# Patient Record
Sex: Female | Born: 1970 | Race: White | Hispanic: No | Marital: Single | Smoking: Current every day smoker
Health system: Southern US, Community
[De-identification: ages and names within clinical notes are randomized; demographics above are authoritative.]

## PROBLEM LIST (undated history)

## (undated) DIAGNOSIS — I10 Essential (primary) hypertension: Secondary | ICD-10-CM

## (undated) DIAGNOSIS — I340 Nonrheumatic mitral (valve) insufficiency: Secondary | ICD-10-CM

## (undated) DIAGNOSIS — Z720 Tobacco use: Secondary | ICD-10-CM

## (undated) DIAGNOSIS — E7800 Pure hypercholesterolemia, unspecified: Secondary | ICD-10-CM

## (undated) DIAGNOSIS — I201 Angina pectoris with documented spasm: Secondary | ICD-10-CM

## (undated) HISTORY — PX: TUBAL LIGATION: SHX77

## (undated) HISTORY — PX: CHOLECYSTECTOMY: SHX55

---

## 2017-10-01 DIAGNOSIS — I340 Nonrheumatic mitral (valve) insufficiency: Secondary | ICD-10-CM

## 2017-10-01 HISTORY — DX: Nonrheumatic mitral (valve) insufficiency: I34.0

## 2017-11-26 ENCOUNTER — Emergency Department
Admission: EM | Admit: 2017-11-26 | Discharge: 2017-11-26 | Discharge status: Against Medical Advice | Payer: Medicaid - Out of State | Attending: Emergency Medicine | Admitting: Emergency Medicine

## 2017-11-26 ENCOUNTER — Encounter: Payer: Self-pay | Admitting: Emergency Medicine

## 2017-11-26 ENCOUNTER — Emergency Department: Payer: Medicaid - Out of State

## 2017-11-26 ENCOUNTER — Other Ambulatory Visit: Payer: Self-pay

## 2017-11-26 DIAGNOSIS — F17210 Nicotine dependence, cigarettes, uncomplicated: Secondary | ICD-10-CM | POA: Diagnosis not present

## 2017-11-26 DIAGNOSIS — Z79899 Other long term (current) drug therapy: Secondary | ICD-10-CM | POA: Insufficient documentation

## 2017-11-26 DIAGNOSIS — I10 Essential (primary) hypertension: Secondary | ICD-10-CM | POA: Diagnosis not present

## 2017-11-26 DIAGNOSIS — R079 Chest pain, unspecified: Secondary | ICD-10-CM | POA: Diagnosis present

## 2017-11-26 DIAGNOSIS — I214 Non-ST elevation (NSTEMI) myocardial infarction: Secondary | ICD-10-CM | POA: Insufficient documentation

## 2017-11-26 HISTORY — DX: Essential (primary) hypertension: I10

## 2017-11-26 HISTORY — DX: Nonrheumatic mitral (valve) insufficiency: I34.0

## 2017-11-26 HISTORY — DX: Angina pectoris with documented spasm: I20.1

## 2017-11-26 HISTORY — DX: Pure hypercholesterolemia, unspecified: E78.00

## 2017-11-26 HISTORY — DX: Tobacco use: Z72.0

## 2017-11-26 LAB — CBC
HCT: 44.2 % (ref 35.0–47.0)
HEMOGLOBIN: 14.7 g/dL (ref 12.0–16.0)
MCH: 28.4 pg (ref 26.0–34.0)
MCHC: 33.1 g/dL (ref 32.0–36.0)
MCV: 85.7 fL (ref 80.0–100.0)
Platelets: 218 10*3/uL (ref 150–440)
RBC: 5.16 MIL/uL (ref 3.80–5.20)
RDW: 15.8 % — ABNORMAL HIGH (ref 11.5–14.5)
WBC: 9.1 10*3/uL (ref 3.6–11.0)

## 2017-11-26 LAB — COMPREHENSIVE METABOLIC PANEL
ALBUMIN: 3.7 g/dL (ref 3.5–5.0)
ALT: 19 U/L (ref 14–54)
AST: 24 U/L (ref 15–41)
Alkaline Phosphatase: 76 U/L (ref 38–126)
Anion gap: 10 (ref 5–15)
BUN: 9 mg/dL (ref 6–20)
CO2: 23 mmol/L (ref 22–32)
CREATININE: 0.67 mg/dL (ref 0.44–1.00)
Calcium: 8.9 mg/dL (ref 8.9–10.3)
Chloride: 104 mmol/L (ref 101–111)
GFR calc Af Amer: >60 mL/min (ref >60)
GLUCOSE: 104 mg/dL — AB (ref 65–99)
POTASSIUM: 3.8 mmol/L (ref 3.5–5.1)
Sodium: 137 mmol/L (ref 135–145)
Total Bilirubin: 0.3 mg/dL (ref 0.3–1.2)
Total Protein: 7.1 g/dL (ref 6.5–8.1)

## 2017-11-26 LAB — TROPONIN I: Troponin I: 0.23 ng/mL (ref <0.03)

## 2017-11-26 LAB — BILIRUBIN, DIRECT

## 2017-11-26 MED ORDER — AMITRIPTYLINE HCL 50 MG PO TABS
25.0000 mg | ORAL_TABLET | Freq: Every day | ORAL | Status: DC
  Administered 2017-11-26: 25 mg via ORAL

## 2017-11-26 MED ORDER — AMLODIPINE BESYLATE 5 MG PO TABS
10.0000 mg | ORAL_TABLET | Freq: Once | ORAL | Status: AC
  Administered 2017-11-26: 10 mg via ORAL
  Filled 2017-11-26: qty 2

## 2017-11-26 MED ORDER — AMITRIPTYLINE HCL 10 MG PO TABS
ORAL_TABLET | ORAL | Status: AC
  Filled 2017-11-26: qty 3

## 2017-11-26 MED ORDER — ISOSORBIDE MONONITRATE ER 60 MG PO TB24
120.0000 mg | ORAL_TABLET | Freq: Every day | ORAL | Status: DC
  Administered 2017-11-26: 120 mg via ORAL
  Filled 2017-11-26: qty 2

## 2017-11-26 MED ORDER — ATORVASTATIN CALCIUM 20 MG PO TABS
80.0000 mg | ORAL_TABLET | Freq: Every day | ORAL | Status: DC
  Administered 2017-11-26: 80 mg via ORAL

## 2017-11-26 MED ORDER — NITROGLYCERIN 0.4 MG SL SUBL: 0.4000 mg | SUBLINGUAL_TABLET | SUBLINGUAL | 3 refills | Status: DC | PRN

## 2017-11-26 MED ORDER — ATORVASTATIN CALCIUM 20 MG PO TABS
ORAL_TABLET | ORAL | Status: AC
  Administered 2017-11-26: 80 mg via ORAL
  Filled 2017-11-26: qty 4

## 2017-11-26 MED ORDER — CITALOPRAM HYDROBROMIDE 20 MG PO TABS
40.0000 mg | ORAL_TABLET | Freq: Every day | ORAL | Status: DC
  Administered 2017-11-26: 40 mg via ORAL
  Filled 2017-11-26: qty 2

## 2017-11-26 NOTE — ED Provider Notes (Signed)
[facility] Emergency Department Provider Note   ____________________________________________   First MD Initiated Contact with Patient 11/26/17 1516     (approximate)  I have reviewed the triage vital signs and the nursing notes.   HISTORY  Chief Complaint Chest Pain    Virginia White is a 47 y.o. female Who comes in complaining of chest tightness with nausea and some vomiting and sweating. Started at 3:00 this morning she has had multiple short episodes but then 1 episode didn't last about 30 minutes. She says when she gets these longer episodes her troponin goes up. She is having vasospasm. She says she just moved to the area from [place]. She reports she is a smoker although she started Chantix 3 days ago. Last 2 times she is taking Chantix she got nauseated. He has a family history of heart disease and mother had a heart attack at age 48 grandfather had a heart attack at age 48 has had a CABG 5 brothers 48 years old and has extensive 3 heart attacks. Patient reports she was having chest pain when EMS arrived they gave her 2 nitroglycerin sprays the pain is not totally gone.   Past Medical History:  Diagnosis Date  . Elevated cholesterol   . Hypertension   . 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   Past Surgical History:  Procedure Laterality Date  . CHOLECYSTECTOMY    . TUBAL LIGATION      Prior to Admission medications   Medication Sig Start Date End Date Taking? Authorizing Provider  amitriptyline (ELAVIL) 25 MG tablet Take 25 mg by mouth at bedtime. 10/25/17  Yes [provider]  amLODipine (NORVASC) 10 MG tablet Take 10 mg by mouth daily. 10/24/17  Yes [provider]  atorvastatin (LIPITOR) 80 MG tablet Take 80 mg by mouth daily.   Yes [provider]  citalopram (CELEXA) 40 MG tablet Take 40 mg by mouth daily.   Yes [provider]  isosorbide mononitrate (IMDUR) 60 MG 24 hr tablet Take 120 mg by mouth daily. 10/24/17  Yes [provider]    Allergies Patient has no allergy information on record.  History reviewed. No pertinent family history.  Social History Social History   Tobacco Use  . Smoking status: Current Every Day Smoker    Packs/day: 0.50    Types: Cigarettes  . Smokeless tobacco: Never Used  Substance Use Topics  . Alcohol use: No    Frequency: Never  . Drug use: No    Review of Systems  Constitutional: No fever/chills Eyes: No visual changes. ENT: No sore throat. Cardiovascular: Denies chest pain at present. Respiratory: Denies shortness of breath. Gastrointestinal: at present No abdominal pain.  No nausea, no vomiting.  No diarrhea.  No constipation. Genitourinary: Negative for dysuria. Musculoskeletal: Negative for back pain. Skin: Negative for rash. Neurological: Negative for headaches, focal weakness or   ____________________________________________   PHYSICAL EXAM:  VITAL SIGNS: ED Triage Vitals [11/26/17 1518]  Enc Vitals Group     BP (!) 129/91     Pulse Rate 77     Resp 10     Temp 97.6 F (36.4 C)     Temp Source Oral     SpO2 100 %     Weight 210 lb (95.3 kg)     Height 5\' 6"  (1.676 m)     Head  Circumference      Peak Flow      Pain Score 2     Pain Loc      Pain Edu?      Excl. in GC?     Constitutional: Alert and oriented. Well appearing and in no acute distress. Eyes: Conjunctivae are normal.  Head: Atraumatic. Nose: No congestion/rhinnorhea. Mouth/Throat: Mucous membranes are moist.  Oropharynx non-erythematous. Neck: No stridor. Cardiovascular: Normal rate, regular rhythm. Grossly normal heart sounds.  Good peripheral circulation. Respiratory: Normal respiratory effort.  No retractions. Lungs CTAB. Gastrointestinal: Soft and nontender. No distention. No abdominal bruits. No CVA tenderness  Musculoskeletal: No lower extremity  tenderness nor edema.  No joint effusions. Neurologic:  Normal speech and language. No gross focal neurologic deficits are appreciated. No gait instability. Skin:  Skin is warm, dry and intact. patient has vascular spiders on her upper chest and back. Psychiatric: Mood and affect are normal. Speech and behavior are normal.  ____________________________________________   LABS (all labs ordered are listed, but only abnormal results are displayed)  Labs Reviewed  CBC - Abnormal; Notable for the following components:      Result Value   RDW 15.8 (*)    All other components within normal limits  TROPONIN I - Abnormal; Notable for the following components:   Troponin I 0.23 (*)    All other components within normal limits  COMPREHENSIVE METABOLIC PANEL - Abnormal; Notable for the following components:   Glucose, Bld 104 (*)    All other components within normal limits  BILIRUBIN, DIRECT - Abnormal; Notable for the following components:   Bilirubin, Direct <0.1 (*)    All other components within normal limits  POC URINE PREG, ED   ____________________________________________  EKG  EKG read and interpreted by me shows right axis no acute ST-T wave changes that may be lead reversal. ____________________________________________  RADIOLOGY  ED MD interpretation:  Official radiology report(s): Dg Chest 2 View  Result Date: 11/26/2017 CLINICAL DATA:  Shortness of breath and chest pain EXAM: CHEST - 2 VIEW COMPARISON:  None. FINDINGS: There is atelectatic change in the lingula. Lungs elsewhere are clear. Heart size and pulmonary vascularity are normal. No adenopathy. No bone lesions. No pneumothorax. IMPRESSION: Atelectatic change in the lingula. Lungs elsewhere clear. No adenopathy. Electronically Signed   By: Bretta Bang III M.D.   On: 11/26/2017 15:48    ____________________________________________   PROCEDURES  Procedure(s) performed:   Procedures  Critical Care  performed:   ____________________________________________   INITIAL IMPRESSION / ASSESSMENT AND PLAN / ED COURSE  patient does not want to stay in the hospital. I have told her she is at risk for having a heart attack and dying possibly even a parking lot she understands this and can repeat back to me but she doesn't want to stay in the hospital she is getting to see her doctor tomorrow. I will give her her medicines she is post to take today that she ran out of here now. She says she's had this happen to her repeatedly in the past and if she is on her medicines doesn't I will still give her an AMA discharge she knows that this is a dangerous thing for her to do. The hospital doctor and the nurses also talked about this.         ____________________________________________   FINAL CLINICAL IMPRESSION(S) / ED DIAGNOSES  Final diagnoses:  NSTEMI (non-ST elevated myocardial infarction) [facility])     ED Discharge Orders  None       Note:  This document was prepared using Dragon voice recognition software and may include unintentional dictation errors.    Arnaldo Natal, MD 11/26/17 [phone]

## 2017-11-26 NOTE — ED Notes (Signed)
Pt educated that BP was elevated and pt reports she had just taken her medication and she was not worried about it. PT also reports that Dr. Darnelle CatalanMalinda talked to her in length about admission and she is still wanting to go home. PT is aware that she is leaving against medical advice. RN took pt to car via Wheelchair and pt was able to ambulate independently from wheelchair into car. PT in NAd at this time but is verbalizing that the pain has started to return in chest but that she still wants to leave.

## 2017-11-26 NOTE — ED Triage Notes (Signed)
Pt to ED from home reporting centralized chest pain that began at 3 this morning. PT reports pain is squeezing in nature with numbness in arms and "thumping in both ears" Pt reports she has been taking isosorbide and amlodapine but has not taken this medication in the past couple days.   SOB, diaphoretic, Nausea and vomiting reported while pain was at its worst. EMS administered 324 ASA and 2 sparys of Nitro that pt report decreased her 10/10 pain to 2/10. Pt also recieved 4mg  of Zofran after reporting nausea currently with vomiting last night. Per pt has hx of multiple MIs

## 2017-11-26 NOTE — Discharge Instructions (Signed)
I really wish you would stay in the hospital would be much safer. If you develop chest pain again please sit down or lay down and take one of the nitroglycerin under the tongue. If that does not get you to the pain wait 5 minutes take another one and if that doesn't get rid of the pain take a third one 5 minutes after the second one. I would call the ambulance if you get any chest pain what so ever. Make sure you see your doctor tomorrow. If you change your mind feel free to come back at any time.

## 2017-11-26 NOTE — Consult Note (Signed)
[facility] [place] [facility]   PATIENT NAME: Virginia White    MR#:  [id number]  DATE OF BIRTH:  1971-09-01  DATE OF ADMISSION:  11/26/2017  PRIMARY CARE PHYSICIAN: System, Pcp Not In   CONSULT REQUESTING/REFERRING PHYSICIAN: Dr. Darnelle CatalanMalinda  REASON FOR CONSULT: Chest pain, elevated troponin  CHIEF COMPLAINT:   Chief Complaint  Patient presents with  . Chest Pain    HISTORY OF PRESENT ILLNESS:  Virginia White  is a 47 y.o. female with a known history of hypertension, coronary vasospasms with a normal cardiac catheterization per patient 6 weeks back presents to the emergency room due to acute onset of chest pain.  Patient takes imdur and Norvasc for her blood pressure.  She has history of coronary vasospasms and uses sublingual nitro. Patient has no ST elevation.  But does have elevated troponin of 0.23.  Presently is chest pain-free.  Needs admission to the hospital.  PAST MEDICAL HISTORY:   Past Medical History:  Diagnosis Date  . Coronary vasospasm (HCC)   . Elevated cholesterol   . Hypertension   . 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 Tobacco use     PAST SURGICAL HISTOIRY:   Past Surgical History:  Procedure Laterality Date  . CHOLECYSTECTOMY    . TUBAL LIGATION      SOCIAL HISTORY:   Social History   Tobacco Use  . Smoking status: Current Every Day Smoker    Packs/day: 0.50    Types: Cigarettes  . Smokeless tobacco: Never Used  Substance Use Topics  . Alcohol use: No    Frequency: Never    FAMILY HISTORY:   Family History  Problem Relation Age of Onset  . CAD Father     DRUG ALLERGIES:  Not on File  REVIEW OF SYSTEMS:   Review of Systems  Constitutional: Positive for malaise/fatigue. Negative for chills, fever and weight loss.  HENT: Negative for hearing loss and nosebleeds.   Eyes: Negative for blurred vision, double vision and pain.  Respiratory: Negative for cough, hemoptysis, sputum  production, shortness of breath and wheezing.   Cardiovascular: Positive for chest pain. Negative for palpitations, orthopnea and leg swelling.  Gastrointestinal: Negative for abdominal pain, constipation, diarrhea, nausea and vomiting.  Genitourinary: Negative for dysuria and hematuria.  Musculoskeletal: Negative for back pain, falls and myalgias.  Skin: Negative for rash.  Neurological: Positive for weakness. Negative for dizziness, tremors, sensory change, speech change, focal weakness, seizures and headaches.  Endo/Heme/Allergies: Does not bruise/bleed easily.  Psychiatric/Behavioral: Negative for depression and memory loss. The patient is not nervous/anxious.    MEDICATIONS AT HOME:   Prior to Admission medications   Medication Sig Start Date End Date Taking? Authorizing Provider  amitriptyline (ELAVIL) 25 MG tablet Take 25 mg by mouth at bedtime. 10/25/17  Yes [provider]  amLODipine (NORVASC) 10 MG tablet Take 10 mg by mouth daily. 10/24/17  Yes [provider]  atorvastatin (LIPITOR) 80 MG tablet Take 80 mg by mouth daily.   Yes [provider]  citalopram (CELEXA) 40 MG tablet Take 40 mg by mouth daily.   Yes [provider]  isosorbide mononitrate (IMDUR) 60 MG 24 hr tablet Take 120 mg by mouth daily. 10/24/17  Yes [provider]  nitroGLYCERIN (NITROSTAT) 0.4 MG SL tablet Place 1 tablet (0.4 mg total) under the tongue every 5 (five) minutes as needed for chest pain. 11/26/17 11/26/18  Arnaldo NatalMalinda, Paul F, MD  VITAL SIGNS:  Blood pressure (!) 129/91, pulse 77, temperature 97.6 F (36.4 C), temperature source Oral, resp. rate 10, height 5\' 6"  (1.676 m), weight 95.3 kg (210 lb), SpO2 100 %.  PHYSICAL EXAMINATION:  GENERAL:  47 y.o.-year-old patient lying in the bed with no acute distress.  EYES: Pupils equal, round, reactive to light and accommodation. No scleral icterus. Extraocular muscles intact.  HEENT: Head atraumatic,  normocephalic. Oropharynx and nasopharynx clear.  NECK:  Supple, no jugular venous distention. No thyroid enlargement, no tenderness.  LUNGS: Normal breath sounds bilaterally, no wheezing, rales,rhonchi or crepitation. No use of accessory muscles of respiration.  CARDIOVASCULAR: S1, S2 normal. No murmurs, rubs, or gallops.  ABDOMEN: Soft, nontender, nondistended. Bowel sounds present. No organomegaly or mass.  EXTREMITIES: No pedal edema, cyanosis, or clubbing.  NEUROLOGIC: Cranial nerves II through XII are intact. Muscle strength 5/5 in all extremities. Sensation intact. Gait not checked.  PSYCHIATRIC: The patient is alert and oriented x 3.  SKIN: No obvious rash, lesion, or ulcer.   LABORATORY PANEL:   CBC Recent Labs  Lab 11/26/17 1525  WBC 9.1  HGB 14.7  HCT 44.2  PLT 218   ------------------------------------------------------------------------------------------------------------------  Chemistries  Recent Labs  Lab 11/26/17 1525  NA 137  K 3.8  CL 104  CO2 23  GLUCOSE 104*  BUN 9  CREATININE 0.67  CALCIUM 8.9  AST 24  ALT 19  ALKPHOS 76  BILITOT 0.3   ------------------------------------------------------------------------------------------------------------------  Cardiac Enzymes Recent Labs  Lab 11/26/17 1525  TROPONINI 0.23*   ------------------------------------------------------------------------------------------------------------------  RADIOLOGY:  Dg Chest 2 View  Result Date: 11/26/2017 CLINICAL DATA:  Shortness of breath and chest pain EXAM: CHEST - 2 VIEW COMPARISON:  None. FINDINGS: There is atelectatic change in the lingula. Lungs elsewhere are clear. Heart size and pulmonary vascularity are normal. No adenopathy. No bone lesions. No pneumothorax. IMPRESSION: Atelectatic change in the lingula. Lungs elsewhere clear. No adenopathy. Electronically Signed   By: Bretta Bang III M.D.   On: 11/26/2017 15:48    EKG:   Orders placed or  performed during the hospital encounter of 11/26/17  . EKG 12-Lead  . EKG 12-Lead  . ED EKG within 10 minutes  . ED EKG within 10 minutes    IMPRESSION AND PLAN:   * Non-ST elevation MI Aspirin, nitrates. Patient will need to be started on heparin drip and cardiac catheterization.  Patient after discussing with her family at bedside is not wanting to be admitted.  Advised admission due to life-threatening condition.  Patient tells me that she has follow-up with her doctor in [place] and would like to be discharged from the emergency room.  I discussed the case with Dr. Darnelle Catalan of emergency room and returned to the patient's room again and advised admission.  But she is refusing this. Advised patient to return if symptoms recur or any worsening.  *Hypertension.  Continue Norvasc.  All the records are reviewed and case discussed with Consulting provider. Management plans discussed with the patient, family and they are in agreement.  CODE STATUS: FULL CODE  TOTAL TIME TAKING CARE OF THIS PATIENT: 40 minutes.   Orie Fisherman M.D on 11/26/2017 at 6:32 PM  Between 7am to 6pm - Pager - [phone]  After 6pm go to [email or web address] - password [email or web address] [facility]  [facility]  Office  [phone]  CC: Primary care Physician: System, Pcp Not In  Note: This dictation was prepared with Dragon dictation along with smaller phrase technology. Any transcriptional  errors that result from this process are unintentional.

## 2017-12-29 ENCOUNTER — Emergency Department
Admission: EM | Admit: 2017-12-29 | Discharge: 2017-12-30 | Disposition: A | Discharge status: Home / Self Care | Payer: Medicaid - Out of State | Attending: Emergency Medicine | Admitting: Emergency Medicine

## 2017-12-29 ENCOUNTER — Other Ambulatory Visit: Payer: Self-pay

## 2017-12-29 DIAGNOSIS — Z79899 Other long term (current) drug therapy: Secondary | ICD-10-CM | POA: Diagnosis not present

## 2017-12-29 DIAGNOSIS — F17210 Nicotine dependence, cigarettes, uncomplicated: Secondary | ICD-10-CM | POA: Diagnosis not present

## 2017-12-29 DIAGNOSIS — I252 Old myocardial infarction: Secondary | ICD-10-CM | POA: Insufficient documentation

## 2017-12-29 DIAGNOSIS — Z9049 Acquired absence of other specified parts of digestive tract: Secondary | ICD-10-CM | POA: Diagnosis not present

## 2017-12-29 DIAGNOSIS — H9202 Otalgia, left ear: Secondary | ICD-10-CM | POA: Diagnosis present

## 2017-12-29 DIAGNOSIS — H66002 Acute suppurative otitis media without spontaneous rupture of ear drum, left ear: Secondary | ICD-10-CM | POA: Insufficient documentation

## 2017-12-29 DIAGNOSIS — I10 Essential (primary) hypertension: Secondary | ICD-10-CM | POA: Insufficient documentation

## 2017-12-29 MED ORDER — AMOXICILLIN 500 MG PO CAPS
500.0000 mg | ORAL_CAPSULE | Freq: Once | ORAL | Status: AC
  Administered 2017-12-29: 500 mg via ORAL
  Filled 2017-12-29: qty 1

## 2017-12-29 MED ORDER — AMOXICILLIN 500 MG PO CAPS: 500.0000 mg | ORAL_CAPSULE | Freq: Three times a day (TID) | ORAL | 0 refills | Status: AC

## 2017-12-29 MED ORDER — ACETAMINOPHEN 325 MG PO TABS
650.0000 mg | ORAL_TABLET | Freq: Once | ORAL | Status: AC
  Administered 2017-12-29: 650 mg via ORAL
  Filled 2017-12-29: qty 2

## 2017-12-29 NOTE — ED Triage Notes (Addendum)
Pt arrives to ED via POV from home with c/o LEFT ear pain x2 weeks. 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 Pt's family member states it might be the plastic piece from a set of ear buds/headphones when visualized with a flashlight.

## 2017-12-29 NOTE — ED Provider Notes (Signed)
[facility] Emergency Department Provider Note  ____________________________________________  Time seen: Approximately 10:52 PM  I have reviewed the triage vital signs and the nursing notes.   HISTORY  Chief Complaint Foreign Body in Ear    HPI Virginia White is a 47 y.o. female presents to the emergency department with 10 out of 10 left otalgia that worsened in intensity today patient reports that she has noticed symptoms for approximately 2 weeks.  Patient is concerned that something is stuck in her ear.  No alleviating measures have been attempted.  Past Medical History:  Diagnosis Date  . Coronary vasospasm (HCC)   . Elevated cholesterol   . Hypertension   . 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 Tobacco use     There are no active problems to display for this patient.   Past Surgical History:  Procedure Laterality Date  . CHOLECYSTECTOMY    . TUBAL LIGATION      Prior to Admission medications   Medication Sig Start Date End Date Taking? Authorizing Provider  amitriptyline (ELAVIL) 25 MG tablet Take 25 mg by mouth at bedtime. 10/25/17   [provider]  amLODipine (NORVASC) 10 MG tablet Take 10 mg by mouth daily. 10/24/17   [provider]  amoxicillin (AMOXIL) 500 MG capsule Take 1 capsule (500 mg total) by mouth 3 (three) times daily for 10 days. 12/29/17 01/08/18  Orvil FeilWoods, Jaclyn M, PA-C  atorvastatin (LIPITOR) 80 MG tablet Take 80 mg by mouth daily.    [provider]  citalopram (CELEXA) 40 MG tablet Take 40 mg by mouth daily.    [provider]  isosorbide mononitrate (IMDUR) 60 MG 24 hr tablet Take 120 mg by mouth daily. 10/24/17   [provider]  nitroGLYCERIN (NITROSTAT) 0.4 MG SL tablet Place 1 tablet (0.4 mg total) under the tongue every 5 (five) minutes as needed for chest pain. 11/26/17 11/26/18  Arnaldo NatalMalinda, Paul F, MD    Allergies Patient has no known  allergies.  Family History  Problem Relation Age of Onset  . CAD Father     Social History Social History   Tobacco Use  . Smoking status: Current Every Day Smoker    Packs/day: 0.50    Types: Cigarettes  . Smokeless tobacco: Never Used  Substance Use Topics  . Alcohol use: No    Frequency: Never  . Drug use: No     Review of Systems  Constitutional: No fever/chills Eyes: No visual changes. No discharge ENT: Patient has left ear pain.  Cardiovascular: no chest pain. Respiratory: no cough. No SOB. Gastrointestinal: No abdominal pain.  No nausea, no vomiting.  No diarrhea.  No constipation. Musculoskeletal: Negative for musculoskeletal pain. Skin: Negative for rash, abrasions, lacerations, ecchymosis.   ____________________________________________   PHYSICAL EXAM:  VITAL SIGNS: ED Triage Vitals [12/29/17 2017]  Enc Vitals Group     BP (!) 152/93     Pulse Rate 92     Resp 18     Temp 98.7 F (37.1 C)     Temp Source Oral     SpO2 100 %     Weight 204 lb (92.5 kg)     Height 5\' 6"  (1.676 m)     Head Circumference      Peak Flow      Pain Score 10     Pain Loc      Pain Edu?      Excl. in GC?  Constitutional: Alert and oriented. Well appearing and in no acute distress. Eyes: Conjunctivae are normal. PERRL. EOMI. Head: Atraumatic. ENT:      Ears: Left tympanic membrane is erythematous and effused with purulent exudate visualized.  Right tympanic membrane is pearly.      Nose: No congestion/rhinnorhea.      Mouth/Throat: Mucous membranes are moist.  Hematological/Lymphatic/Immunilogical: No cervical lymphadenopathy. Cardiovascular: Normal rate, regular rhythm. Normal S1 and S2.  Good peripheral circulation. Respiratory: Normal respiratory effort without tachypnea or retractions. Lungs CTAB. Good air entry to the bases with no decreased or absent breath sounds. Musculoskeletal: Full range of motion to all extremities. No gross deformities  appreciated. Neurologic:  Normal speech and language. No gross focal neurologic deficits are appreciated.  Skin:  Skin is warm, dry and intact. No rash noted.  ____________________________________________   LABS (all labs ordered are listed, but only abnormal results are displayed)  Labs Reviewed - No data to display ____________________________________________  EKG   ____________________________________________  RADIOLOGY   No results found.  ____________________________________________    PROCEDURES  Procedure(s) performed:    Procedures    Medications  acetaminophen (TYLENOL) tablet 650 mg (650 mg Oral Given 12/29/17 2150)  amoxicillin (AMOXIL) capsule 500 mg (500 mg Oral Given 12/29/17 2150)     ____________________________________________   INITIAL IMPRESSION / ASSESSMENT AND PLAN / ED COURSE  Pertinent labs & imaging results that were available during my care of the patient were reviewed by me and considered in my medical decision making (see chart for details).  Review of the [place] CSRS was performed in accordance of the [facility] prior to dispensing any controlled drugs.    Assessment and plan Otitis media Patient presents to the emergency department with left otalgia.  On physical exam, patient had an erythematous tympanic membrane with evidence of purulent exudate.  Physical exam findings are consistent with otitis media.  Original differential diagnosis included otitis media versus left ear foreign body.  No foreign body was visualized on physical exam.  Vital signs are reassuring prior to discharge.  All patient questions were answered.     ____________________________________________  FINAL CLINICAL IMPRESSION(S) / ED DIAGNOSES  Final diagnoses:  Acute suppurative otitis media of left ear without spontaneous rupture of tympanic membrane, recurrence not specified      NEW MEDICATIONS STARTED DURING THIS VISIT:  ED Discharge Orders         Ordered    amoxicillin (AMOXIL) 500 MG capsule  3 times daily     12/29/17 2134          This chart was dictated using voice recognition software/Dragon. Despite best efforts to proofread, errors can occur which can change the meaning. Any change was purely unintentional.    Bennie, Scaff, PA-C 12/29/17 2255    Phineas Semen, MD 12/29/17 [phone]

## 2017-12-29 NOTE — ED Notes (Signed)
Pt states that she has something stuck in her left ear for 2 weeks. She thought it was just allergies, but she has gone completely deaf in her ear now and she is in a lot of pain. Family at bedside. Pt is alert and oriented x 4.

## 2018-01-09 ENCOUNTER — Emergency Department
Admission: EM | Admit: 2018-01-09 | Discharge: 2018-01-09 | Disposition: A | Discharge status: Home / Self Care | Payer: Medicaid Other | Attending: Emergency Medicine | Admitting: Emergency Medicine

## 2018-01-09 ENCOUNTER — Other Ambulatory Visit: Payer: Self-pay

## 2018-01-09 DIAGNOSIS — Z79899 Other long term (current) drug therapy: Secondary | ICD-10-CM | POA: Diagnosis not present

## 2018-01-09 DIAGNOSIS — F17210 Nicotine dependence, cigarettes, uncomplicated: Secondary | ICD-10-CM | POA: Diagnosis not present

## 2018-01-09 DIAGNOSIS — J069 Acute upper respiratory infection, unspecified: Secondary | ICD-10-CM | POA: Diagnosis not present

## 2018-01-09 DIAGNOSIS — I10 Essential (primary) hypertension: Secondary | ICD-10-CM | POA: Diagnosis not present

## 2018-01-09 DIAGNOSIS — R05 Cough: Secondary | ICD-10-CM | POA: Diagnosis present

## 2018-01-09 DIAGNOSIS — B9789 Other viral agents as the cause of diseases classified elsewhere: Secondary | ICD-10-CM | POA: Insufficient documentation

## 2018-01-09 MED ORDER — BENZONATATE 100 MG PO CAPS: ORAL_CAPSULE | ORAL | 0 refills | Status: AC

## 2018-01-09 MED ORDER — FLUTICASONE PROPIONATE 50 MCG/ACT NA SUSP: 2.0000 | Freq: Every day | NASAL | 0 refills | Status: AC

## 2018-01-09 MED ORDER — DEXAMETHASONE SODIUM PHOSPHATE 10 MG/ML IJ SOLN
10.0000 mg | Freq: Once | INTRAMUSCULAR | Status: AC
  Administered 2018-01-09: 10 mg via INTRAMUSCULAR
  Filled 2018-01-09: qty 1

## 2018-01-09 MED ORDER — PREDNISONE 10 MG (21) PO TBPK: ORAL_TABLET | ORAL | 0 refills | Status: DC

## 2018-01-09 NOTE — ED Notes (Signed)
C/o LFT eye drainage and redness. Redness noted.

## 2018-01-09 NOTE — Discharge Instructions (Addendum)
Your symptoms are likely viral in nature. You may also be experiencing some allergy symptoms. Take the meds as prescribed. Also start on a daily allergy medicine. Follow-up with your provider or [facility] as needed.

## 2018-01-09 NOTE — ED Triage Notes (Signed)
Pt arrives to ED c/o of cough and nasal congestion. Non-productive cough. Also c/o eyes watering. Denies seasonal allergies. States "the place we'restaying at has mold in it." States had an ear infection few weeks ago and finished antibiotics recently. These symptoms started after finishing antibiotics. Wants L ear checked to make sure ear infection cleared up. Alert, oriented, ambulatory.

## 2018-01-09 NOTE — ED Provider Notes (Signed)
[facility] Emergency Department Provider Note ____________________________________________  Time seen: 1821  I have reviewed the triage vital signs and the nursing notes.  HISTORY  Chief Complaint  Cough  HPI Virginia White is a 47 y.o. female presents to the ED for evaluation of cough and nasal congestion.  Patient describes a nonproductive cough as well as some itchy watery eyes.  She was treated about 2 weeks ago for an ear infection and completed a course of antibiotics.  She describes a possible mold exposure in her current residence.  Denies any interim fevers, chills, sweats patient also is denying any chest pain, wheeze, or shortness of breath.  Past Medical History:  Diagnosis Date  . Coronary vasospasm (HCC)   . Elevated cholesterol   . Hypertension   . 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 Tobacco use     There are no active problems to display for this patient.   Past Surgical History:  Procedure Laterality Date  . CHOLECYSTECTOMY    . TUBAL LIGATION      Prior to Admission medications   Medication Sig Start Date End Date Taking? Authorizing Provider  amitriptyline (ELAVIL) 25 MG tablet Take 25 mg by mouth at bedtime. 10/25/17   [provider]  amLODipine (NORVASC) 10 MG tablet Take 10 mg by mouth daily. 10/24/17   [provider]  atorvastatin (LIPITOR) 80 MG tablet Take 80 mg by mouth daily.    [provider]  benzonatate (TESSALON PERLES) 100 MG capsule Take 1-2 tabs TID prn cough 01/09/18   Seann Genther, Charlesetta Ivory, PA-C  citalopram (CELEXA) 40 MG tablet Take 40 mg by mouth daily.    [provider]  fluticasone (FLONASE) 50 MCG/ACT nasal spray Place 2 sprays into both nostrils daily. 01/09/18   Ousman Dise, Charlesetta Ivory, PA-C  isosorbide mononitrate (IMDUR) 60 MG 24 hr tablet Take 120 mg by mouth daily. 10/24/17   [provider]  nitroGLYCERIN (NITROSTAT) 0.4 MG SL  tablet Place 1 tablet (0.4 mg total) under the tongue every 5 (five) minutes as needed for chest pain. 11/26/17 11/26/18  Arnaldo Natal, MD  predniSONE (STERAPRED UNI-PAK 21 TAB) 10 MG (21) TBPK tablet 6-day taper as directed. 01/09/18   Kynadee Dam, Charlesetta Ivory, PA-C    Allergies Patient has no known allergies.  Family History  Problem Relation Age of Onset  . CAD Father     Social History Social History   Tobacco Use  . Smoking status: Current Every Day Smoker    Packs/day: 0.50    Types: Cigarettes  . Smokeless tobacco: Never Used  Substance Use Topics  . Alcohol use: No    Frequency: Never  . Drug use: No    Review of Systems  Constitutional: Negative for fever. Eyes: Negative for visual changes. ENT: Negative for sore throat.  Nasal congestion as above. Cardiovascular: Negative for chest pain. Respiratory: Negative for shortness of breath.  Reports cough as above. Gastrointestinal: Negative for abdominal pain, vomiting and diarrhea. Genitourinary: Negative for dysuria. Musculoskeletal: Negative for back pain. Skin: Negative for rash. Neurological: Negative for headaches, focal weakness or numbness. ____________________________________________  PHYSICAL EXAM:  VITAL SIGNS: ED Triage Vitals  Enc Vitals Group     BP 01/09/18 1749 (!) 127/101     Pulse Rate 01/09/18 1749 (!) 103     Resp 01/09/18 1749 18     Temp 01/09/18 1749 97.9 F (36.6 C)  Temp Source 01/09/18 1749 Oral     SpO2 01/09/18 1749 99 %     Weight 01/09/18 1750 204 lb (92.5 kg)     Height 01/09/18 1750  (1.676 m)     Head Circumference --      Peak Flow --      Pain Score 01/09/18 1749 8     Pain Loc --      Pain Edu? --      Excl. in GC? --     Constitutional: Alert and oriented. Well appearing and in no distress. Head: Normocephalic and atraumatic. Eyes: Conjunctivae are normal. PERRL. Normal extraocular movements Ears: Canals clear. TMs intact bilaterally.  Left TM is  retracted and dull with some serous fluid.   Nose: No congestion/epistaxis.  Nasal turbinates are pink, moist, and clear rhinorrhea is noted. Mouth/Throat: Mucous membranes are moist.  Uvula is midline and tonsils are flat.  No oropharyngeal lesions are appreciated.  There is some signs of postnasal drainage noted. Neck: Supple. No thyromegaly. Hematological/Lymphatic/Immunological: No cervical lymphadenopathy. Cardiovascular: Normal rate, regular rhythm. Normal distal pulses. Respiratory: Normal respiratory effort. No wheezes/rales/rhonchi. Skin:  Skin is warm, dry and intact. No rash noted.  Multiple axillary pustules noted ____________________________________________  PROCEDURES  Procedures Decadron 10 mg IM ____________________________________________  INITIAL IMPRESSION / ASSESSMENT AND PLAN / ED COURSE  She with ED evaluation of symptoms consistent with a likely rhinitis.  She also has mild left serous effusion.  Patient will be discharged with a prescription for prednisone taper pack, Flonase, Tessalon Perles.  She is encouraged to start an over-the-counter allergy medicine for rhinitis.  She will follow-up with charge to clinic or return to the ED as necessary.  Work note is provided for 3 days as requested. ____________________________________________  FINAL CLINICAL IMPRESSION(S) / ED DIAGNOSES  Final diagnoses:  Viral URI with cough      Jakari Jacot, Charlesetta Ivory, PA-C 01/09/18 2005    Emily Filbert, MD 01/09/18 2258

## 2018-05-16 ENCOUNTER — Emergency Department
Admission: EM | Admit: 2018-05-16 | Discharge: 2018-05-16 | Disposition: A | Discharge status: Home / Self Care | Payer: Medicaid Other | Attending: Emergency Medicine | Admitting: Emergency Medicine

## 2018-05-16 ENCOUNTER — Other Ambulatory Visit: Payer: Self-pay

## 2018-05-16 ENCOUNTER — Encounter: Payer: Self-pay | Admitting: Emergency Medicine

## 2018-05-16 ENCOUNTER — Emergency Department: Payer: Medicaid Other

## 2018-05-16 DIAGNOSIS — F17210 Nicotine dependence, cigarettes, uncomplicated: Secondary | ICD-10-CM | POA: Insufficient documentation

## 2018-05-16 DIAGNOSIS — R079 Chest pain, unspecified: Secondary | ICD-10-CM | POA: Diagnosis present

## 2018-05-16 DIAGNOSIS — I10 Essential (primary) hypertension: Secondary | ICD-10-CM | POA: Diagnosis not present

## 2018-05-16 DIAGNOSIS — R110 Nausea: Secondary | ICD-10-CM | POA: Insufficient documentation

## 2018-05-16 DIAGNOSIS — Z79899 Other long term (current) drug therapy: Secondary | ICD-10-CM | POA: Insufficient documentation

## 2018-05-16 LAB — CBC
HCT: 38.8 % (ref 35.0–47.0)
Hemoglobin: 13.3 g/dL (ref 12.0–16.0)
MCH: 28.4 pg (ref 26.0–34.0)
MCHC: 34.2 g/dL (ref 32.0–36.0)
MCV: 83 fL (ref 80.0–100.0)
PLATELETS: 254 10*3/uL (ref 150–440)
RBC: 4.68 MIL/uL (ref 3.80–5.20)
RDW: 15.6 % — AB (ref 11.5–14.5)
WBC: 11 10*3/uL (ref 3.6–11.0)

## 2018-05-16 LAB — COMPREHENSIVE METABOLIC PANEL
ALT: 15 U/L (ref 0–44)
ANION GAP: 10 (ref 5–15)
AST: 17 U/L (ref 15–41)
Albumin: 3.4 g/dL — ABNORMAL LOW (ref 3.5–5.0)
Alkaline Phosphatase: 88 U/L (ref 38–126)
BUN: 23 mg/dL — ABNORMAL HIGH (ref 6–20)
CHLORIDE: 104 mmol/L (ref 98–111)
CO2: 25 mmol/L (ref 22–32)
Calcium: 8.8 mg/dL — ABNORMAL LOW (ref 8.9–10.3)
Creatinine, Ser: 0.7 mg/dL (ref 0.44–1.00)
Glucose, Bld: 108 mg/dL — ABNORMAL HIGH (ref 70–99)
POTASSIUM: 4.1 mmol/L (ref 3.5–5.1)
SODIUM: 139 mmol/L (ref 135–145)
Total Bilirubin: 0.4 mg/dL (ref 0.3–1.2)
Total Protein: 6.7 g/dL (ref 6.5–8.1)

## 2018-05-16 LAB — TROPONIN I

## 2018-05-16 MED ORDER — OXYCODONE HCL 5 MG PO TABS
5.0000 mg | ORAL_TABLET | Freq: Once | ORAL | Status: AC
  Administered 2018-05-16: 5 mg via ORAL
  Filled 2018-05-16: qty 1

## 2018-05-16 MED ORDER — NITROGLYCERIN 0.4 MG SL SUBL: 0.4000 mg | SUBLINGUAL_TABLET | SUBLINGUAL | 0 refills | Status: AC | PRN

## 2018-05-16 MED ORDER — ASPIRIN 81 MG PO CHEW: 324.0000 mg | CHEWABLE_TABLET | Freq: Once | ORAL | Status: DC

## 2018-05-16 MED ORDER — ISOSORBIDE MONONITRATE ER 60 MG PO TB24: 120.0000 mg | ORAL_TABLET | Freq: Every day | ORAL | 0 refills | Status: DC

## 2018-05-16 MED ORDER — CITALOPRAM HYDROBROMIDE 40 MG PO TABS: 40.0000 mg | ORAL_TABLET | Freq: Every day | ORAL | 0 refills | Status: AC

## 2018-05-16 MED ORDER — ONDANSETRON HCL 4 MG/2ML IJ SOLN
4.0000 mg | Freq: Once | INTRAMUSCULAR | Status: AC
  Administered 2018-05-16: 4 mg via INTRAVENOUS

## 2018-05-16 MED ORDER — ATORVASTATIN CALCIUM 80 MG PO TABS: 80.0000 mg | ORAL_TABLET | Freq: Every day | ORAL | 0 refills | Status: DC

## 2018-05-16 MED ORDER — ACETAMINOPHEN 325 MG PO TABS
ORAL_TABLET | ORAL | Status: AC
  Administered 2018-05-16: 650 mg via ORAL
  Filled 2018-05-16: qty 2

## 2018-05-16 MED ORDER — ASPIRIN EC 81 MG PO TBEC: 81.0000 mg | DELAYED_RELEASE_TABLET | Freq: Every day | ORAL | 0 refills | Status: AC

## 2018-05-16 MED ORDER — AMLODIPINE BESYLATE 5 MG PO TABS
10.0000 mg | ORAL_TABLET | Freq: Once | ORAL | Status: AC
  Administered 2018-05-16: 10 mg via ORAL
  Filled 2018-05-16: qty 2

## 2018-05-16 MED ORDER — ONDANSETRON HCL 4 MG/2ML IJ SOLN
INTRAMUSCULAR | Status: AC
  Administered 2018-05-16: 4 mg via INTRAVENOUS
  Filled 2018-05-16: qty 2

## 2018-05-16 MED ORDER — ISOSORBIDE MONONITRATE ER 60 MG PO TB24
60.0000 mg | ORAL_TABLET | Freq: Every day | ORAL | Status: DC
  Administered 2018-05-16: 60 mg via ORAL
  Filled 2018-05-16: qty 1

## 2018-05-16 MED ORDER — ATORVASTATIN CALCIUM 80 MG PO TABS: 80.0000 mg | ORAL_TABLET | Freq: Every day | ORAL | 0 refills | Status: AC

## 2018-05-16 MED ORDER — ASPIRIN EC 81 MG PO TBEC: 81.0000 mg | DELAYED_RELEASE_TABLET | Freq: Every day | ORAL | 0 refills | Status: DC

## 2018-05-16 MED ORDER — AMLODIPINE BESYLATE 10 MG PO TABS: 10.0000 mg | ORAL_TABLET | Freq: Every day | ORAL | 0 refills | Status: AC

## 2018-05-16 MED ORDER — ACETAMINOPHEN 325 MG PO TABS
650.0000 mg | ORAL_TABLET | Freq: Once | ORAL | Status: AC
  Administered 2018-05-16: 650 mg via ORAL

## 2018-05-16 MED ORDER — ATORVASTATIN CALCIUM 20 MG PO TABS: 80.0000 mg | ORAL_TABLET | Freq: Every day | ORAL | Status: DC

## 2018-05-16 MED ORDER — CITALOPRAM HYDROBROMIDE 40 MG PO TABS: 40.0000 mg | ORAL_TABLET | Freq: Every day | ORAL | 0 refills | Status: DC

## 2018-05-16 MED ORDER — CITALOPRAM HYDROBROMIDE 20 MG PO TABS
40.0000 mg | ORAL_TABLET | Freq: Every day | ORAL | Status: DC
  Administered 2018-05-16: 40 mg via ORAL
  Filled 2018-05-16: qty 2

## 2018-05-16 MED ORDER — AMLODIPINE BESYLATE 10 MG PO TABS: 10.0000 mg | ORAL_TABLET | Freq: Every day | ORAL | 0 refills | Status: DC

## 2018-05-16 MED ORDER — ISOSORBIDE MONONITRATE ER 60 MG PO TB24: 120.0000 mg | ORAL_TABLET | Freq: Every day | ORAL | 0 refills | Status: AC

## 2018-05-16 NOTE — ED Notes (Signed)
Pt states she did not have ride, charge RN gave bus pass to [employer] in lobby. Pt states she is working on someone picking her up but will use bus if no able.Pt ambulatory, NAD noted. Security will take pt to bus stop if pt uses bus

## 2018-05-16 NOTE — ED Provider Notes (Signed)
[facility] Emergency Department Provider Note  Time seen: 7:25 AM  I have reviewed the triage vital signs and the nursing notes.   HISTORY  Chief Complaint No chief complaint on file.    HPI [place] Oborn is a 47 y.o. female with a past medical history of coronary vasospasm, hypertension, hyperlipidemia, presents to the emergency department with chest pain.  According to the patient she typically will get chest pain which she describes as pain in the center of her chest moderate in severity often associated with nausea.  States she took nitroglycerin last night which relieved her chest pain but she continued to have chest pain recurred overnight and this morning.  States she was out of nitroglycerin as well as the rest of her medications which ran out within the last several days.  Patient states she has had 3 or 4 "vasospasm heart attacks" but has never had stents placed.  Received 2 sprays of nitroglycerin by EMS as well as aspirin.  Patient's chest pain is currently 1/10 dull pain in the center of her chest.   Past Medical History:  Diagnosis Date  . Coronary vasospasm (HCC)   . Elevated cholesterol   . Hypertension   . 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 Tobacco use     There are no active problems to display for this patient.   Past Surgical History:  Procedure Laterality Date  . CHOLECYSTECTOMY    . TUBAL LIGATION      Prior to Admission medications   Medication Sig Start Date End Date Taking? Authorizing Provider  amitriptyline (ELAVIL) 25 MG tablet Take 25 mg by mouth at bedtime. 10/25/17   [provider]  amLODipine (NORVASC) 10 MG tablet Take 10 mg by mouth daily. 10/24/17   [provider]  atorvastatin (LIPITOR) 80 MG tablet Take 80 mg by mouth daily.    [provider]  benzonatate (TESSALON PERLES) 100 MG capsule Take 1-2 tabs TID prn cough 01/09/18   Menshew, Charlesetta Ivory, PA-C   citalopram (CELEXA) 40 MG tablet Take 40 mg by mouth daily.    [provider]  fluticasone (FLONASE) 50 MCG/ACT nasal spray Place 2 sprays into both nostrils daily. 01/09/18   Menshew, Charlesetta Ivory, PA-C  isosorbide mononitrate (IMDUR) 60 MG 24 hr tablet Take 120 mg by mouth daily. 10/24/17   [provider]  nitroGLYCERIN (NITROSTAT) 0.4 MG SL tablet Place 1 tablet (0.4 mg total) under the tongue every 5 (five) minutes as needed for chest pain. 11/26/17 11/26/18  Arnaldo Natal, MD  predniSONE (STERAPRED UNI-PAK 21 TAB) 10 MG (21) TBPK tablet 6-day taper as directed. 01/09/18   Menshew, Charlesetta Ivory, PA-C    No Known Allergies  Family History  Problem Relation Age of Onset  . CAD Father     Social History Social History   Tobacco Use  . Smoking status: Current Every Day Smoker    Packs/day: 0.50    Types: Cigarettes  . Smokeless tobacco: Never Used  Substance Use Topics  . Alcohol use: No    Frequency: Never  . Drug use: No    Review of Systems Constitutional: Negative for fever. ENT: Negative for recent illness/congestion Cardiovascular: Positive for chest pain intermittent since last night. Respiratory: Negative for shortness of breath. Gastrointestinal: Negative for abdominal pain, vomiting  Musculoskeletal: Negative for leg pain or swelling Skin: Negative for skin complaints  Neurological: Negative for headache All  other ROS negative  ____________________________________________   PHYSICAL EXAM:  Constitutional: Alert and oriented. Well appearing and in no distress. Eyes: Normal exam ENT   Head: Normocephalic and atraumatic.   Mouth/Throat: Mucous membranes are moist. Cardiovascular: Normal rate, regular rhythm. No murmur Respiratory: Normal respiratory effort without tachypnea nor retractions. Breath sounds are clear Gastrointestinal: Soft and nontender. No distention.  Musculoskeletal: Nontender with normal range of motion in all  extremities. No lower extremity tenderness or edema. Neurologic:  Normal speech and language. No gross focal neurologic deficits  Skin:  Skin is warm, dry and intact.  Psychiatric: Mood and affect are normal.   ____________________________________________    EKG  EKG reviewed and interpreted by myself shows normal sinus rhythm at 70 bpm with a narrow QRS, normal axis, normal intervals, no concerning ST changes.  ____________________________________________    RADIOLOGY  Chest x-ray is negative  ____________________________________________   INITIAL IMPRESSION / ASSESSMENT AND PLAN / ED COURSE  Pertinent labs & imaging results that were available during my care of the patient were reviewed by me and considered in my medical decision making (see chart for details).  Patient presents to the emergency department for chest pain intermittent since last night describes as a 1/10 currently status post nitroglycerin by EMS.  We will continue to closely monitor, check labs as well as a chest x-ray.  Patient reports frequent episodes of chest pain due to "vasospasm."  States this feels similar to her typical chest pain but she is out of nitroglycerin.  Differential this time would include vasospasm, CAD/coronary obstruction, chest wall pain, pneumonia, pneumothorax.  Labs are largely within normal limits including a negative troponin.  Chest x-ray is negative.  EKG is reassuring.  Patient continues to state 1 or 2/10 chest pain.  Given her history I did offer admission to the hospital.  Patient strongly wishes to avoid admission to the hospital.  States this feels like her typical chest pain but she is out of her nitroglycerin which is why she is here.  We will dose all the patient's normal medications.  We will recheck a heart enzyme.  If the patient's repeat troponin remains negative anticipate likely discharge home with PCP and cardiology follow-up.  Patient agreeable to plan of  care.  Patient's repeat troponin remains negative.  We will refill the patient's home medications and discharge with PCP and cardiology follow-up.  ____________________________________________   FINAL CLINICAL IMPRESSION(S) / ED DIAGNOSES  Chest pain    Minna Antis, MD 05/16/18 1219

## 2018-05-16 NOTE — ED Triage Notes (Signed)
Pt to ED via EMS from home with c/o CP , cardiac hx and vagal spams per pt. VSS,. PT took 324mg  asprin and 1 nitro PTA. EMS gave 4mg  of zofran in route. NAD noted. MD at bedside

## 2018-08-20 ENCOUNTER — Other Ambulatory Visit: Payer: Self-pay

## 2018-08-20 ENCOUNTER — Encounter: Payer: Self-pay | Admitting: Emergency Medicine

## 2018-08-20 ENCOUNTER — Emergency Department
Admission: EM | Admit: 2018-08-20 | Discharge: 2018-08-20 | Disposition: A | Discharge status: Home / Self Care | Payer: Medicaid Other | Attending: Emergency Medicine | Admitting: Emergency Medicine

## 2018-08-20 DIAGNOSIS — F17210 Nicotine dependence, cigarettes, uncomplicated: Secondary | ICD-10-CM | POA: Insufficient documentation

## 2018-08-20 DIAGNOSIS — Z79899 Other long term (current) drug therapy: Secondary | ICD-10-CM | POA: Insufficient documentation

## 2018-08-20 DIAGNOSIS — I10 Essential (primary) hypertension: Secondary | ICD-10-CM | POA: Insufficient documentation

## 2018-08-20 DIAGNOSIS — A4902 Methicillin resistant Staphylococcus aureus infection, unspecified site: Secondary | ICD-10-CM | POA: Insufficient documentation

## 2018-08-20 DIAGNOSIS — M109 Gout, unspecified: Secondary | ICD-10-CM | POA: Insufficient documentation

## 2018-08-20 DIAGNOSIS — Z7982 Long term (current) use of aspirin: Secondary | ICD-10-CM | POA: Insufficient documentation

## 2018-08-20 MED ORDER — PREDNISONE 50 MG PO TABS: 50.0000 mg | ORAL_TABLET | Freq: Every day | ORAL | 0 refills | Status: AC

## 2018-08-20 MED ORDER — MELOXICAM 7.5 MG PO TABS
15.0000 mg | ORAL_TABLET | Freq: Once | ORAL | Status: AC
  Administered 2018-08-20: 15 mg via ORAL
  Filled 2018-08-20: qty 2

## 2018-08-20 MED ORDER — MELOXICAM 15 MG PO TABS: 15.0000 mg | ORAL_TABLET | Freq: Every day | ORAL | 0 refills | Status: AC

## 2018-08-20 MED ORDER — SULFAMETHOXAZOLE-TRIMETHOPRIM 800-160 MG PO TABS: 1.0000 | ORAL_TABLET | Freq: Two times a day (BID) | ORAL | 0 refills | Status: AC

## 2018-08-20 MED ORDER — PREDNISONE 20 MG PO TABS
60.0000 mg | ORAL_TABLET | Freq: Once | ORAL | Status: AC
  Administered 2018-08-20: 60 mg via ORAL
  Filled 2018-08-20: qty 3

## 2018-08-20 NOTE — ED Triage Notes (Signed)
Pt to ed with c/o left hand fifth digit pain and swelling. Denies injury.

## 2018-08-20 NOTE — ED Notes (Signed)
Pt c/o pain to L hand, pain to pinky finger. Pt also c/o repeat infections to her abdomen, open wound noted to RLQ at this time, pt requesting it be "cleaned out and dressed". Pt is alert and oriented at this time.

## 2018-08-20 NOTE — ED Provider Notes (Signed)
[facility] Emergency Department Provider Note  ____________________________________________  Time seen: Approximately 6:50 PM  I have reviewed the triage vital signs and the nursing notes.   HISTORY  Chief Complaint Hand Pain    HPI Virginia White is a 47 y.o. female who presents the emergency department complaining of 2 complaints.  Patient presents complaining of nontraumatic right fifth PIP joint pain, edema as well as pain and edema to the MTP joint of the great toe left foot.  Patient reports that she has had pain, swelling to both of these areas x2 days.  Patient reports that she is concerned that she has gout as a family member has gout and symptoms are consistent with her relative.  Patient reports that she eats a lot of red meat, but denies any chronic kidney disease.  She also reports that she drinks a lot of tea and sodas.  Patient denies any injury to the area.  She denies any open wounds to the area.  Patient has not tried medication for this complaint.  Patient is also complaining of a erythematous, edematous, draining lesion to the right lower abdominal wall.  Patient reports that she gets frequent "infections" along her waistband line.  Patient reports that she has been placed on antibiotics multiple times for these, they resolve and then will return.  She denies any fevers or chills, domino pain, nausea or vomiting.  No medication for his complaint prior to arrival either.    Past Medical History:  Diagnosis Date  . Coronary vasospasm (HCC)   . Elevated cholesterol   . Hypertension   . 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 Tobacco use     There are no active problems to display for this patient.   Past Surgical History:  Procedure Laterality Date  . CHOLECYSTECTOMY    . TUBAL LIGATION      Prior to Admission medications   Medication Sig Start Date End Date Taking? Authorizing Provider  amitriptyline (ELAVIL)  25 MG tablet Take 25 mg by mouth at bedtime. 10/25/17   [provider]  amLODipine (NORVASC) 10 MG tablet Take 1 tablet (10 mg total) by mouth daily. 05/16/18   Minna Antis, MD  aspirin EC 81 MG tablet Take 1 tablet (81 mg total) by mouth daily. 05/16/18   Minna Antis, MD  atorvastatin (LIPITOR) 80 MG tablet Take 1 tablet (80 mg total) by mouth daily. 05/16/18   Minna Antis, MD  benzonatate (TESSALON PERLES) 100 MG capsule Take 1-2 tabs TID prn cough Patient not taking: Reported on 05/16/2018 01/09/18   Menshew, Charlesetta Ivory, PA-C  citalopram (CELEXA) 40 MG tablet Take 1 tablet (40 mg total) by mouth daily. 05/16/18   Minna Antis, MD  fluticasone (FLONASE) 50 MCG/ACT nasal spray Place 2 sprays into both nostrils daily. Patient not taking: Reported on 05/16/2018 01/09/18   Menshew, Charlesetta Ivory, PA-C  isosorbide mononitrate (IMDUR) 60 MG 24 hr tablet Take 2 tablets (120 mg total) by mouth daily. 05/16/18   Minna Antis, MD  meloxicam (MOBIC) 15 MG tablet Take 1 tablet (15 mg total) by mouth daily. 08/20/18   Latonya Nelon, Delorise Royals, PA-C  nitroGLYCERIN (NITROSTAT) 0.4 MG SL tablet Place 1 tablet (0.4 mg total) under the tongue every 5 (five) minutes as needed for chest pain. 05/16/18 05/16/19  Minna Antis, MD  predniSONE (DELTASONE) 50 MG tablet Take 1 tablet (50 mg total) by mouth daily with breakfast. 08/20/18  Jamone Garrido, Delorise RoyalsJonathan D, PA-C  sulfamethoxazole-trimethoprim (BACTRIM DS,SEPTRA DS) 800-160 MG tablet Take 1 tablet by mouth 2 (two) times daily. 08/20/18   Taaj Hurlbut, Delorise RoyalsJonathan D, PA-C    Allergies Patient has no known allergies.  Family History  Problem Relation Age of Onset  . CAD Father     Social History Social History   Tobacco Use  . Smoking status: Current Every Day Smoker    Packs/day: 0.50    Types: Cigarettes  . Smokeless tobacco: Never Used  Substance Use Topics  . Alcohol use: No    Frequency: Never  . Drug use: No     Review of  Systems  Constitutional: No fever/chills Eyes: No visual changes. No discharge ENT: No upper respiratory complaints. Cardiovascular: no chest pain. Respiratory: no cough. No SOB. Gastrointestinal: No abdominal pain.  No nausea, no vomiting.  No diarrhea.  No constipation. Musculoskeletal: Positive for fifth digit left hand PIP pain, pain to the MCP joint first digit left foot Skin: Positive for erythematous, draining lesion to the right lower abdominal wall Neurological: Negative for headaches, focal weakness or numbness. 10-point ROS otherwise negative.  ____________________________________________   PHYSICAL EXAM:  VITAL SIGNS: ED Triage Vitals  Enc Vitals Group     BP 08/20/18 1728 (!) 148/78     Pulse Rate 08/20/18 1728 88     Resp 08/20/18 1728 18     Temp 08/20/18 1728 98.7 F (37.1 C)     Temp Source 08/20/18 1728 Oral     SpO2 08/20/18 1728 100 %     Weight 08/20/18 1729 201 lb 15.1 oz (91.6 kg)     Height --      Head Circumference --      Peak Flow --      Pain Score 08/20/18 1729 5     Pain Loc --      Pain Edu? --      Excl. in GC? --      Constitutional: Alert and oriented. Well appearing and in no acute distress. Eyes: Conjunctivae are normal. PERRL. EOMI. Head: Atraumatic. ENT:      Ears:       Nose: No congestion/rhinnorhea.      Mouth/Throat: Mucous membranes are moist.  Neck: No stridor.    Cardiovascular: Normal rate, regular rhythm. Normal S1 and S2.  Good peripheral circulation. Respiratory: Normal respiratory effort without tachypnea or retractions. Lungs CTAB. Good air entry to the bases with no decreased or absent breath sounds. Gastrointestinal: Bowel sounds 4 quadrants. Soft and nontender to palpation. No guarding or rigidity. No palpable masses. No distention. No CVA tenderness. Musculoskeletal: Full range of motion to all extremities. No gross deformities appreciated.  Visualization of the PIP joint fifth digit left hand reveals erythema,  edema, minimal warmth to palpation.  Patient is able to flex and extend the joint.  No palpable abnormality to this area.  Area is tender to palpation.  Patient also has mild erythema, edema, warmth to palpation of the MTP joint first digit left foot.  No palpable abnormality.  Area is tender to palpation.  Sensation intact to the left upper and left lower extremity.  Capillary refill less than 2 seconds all digits left hand, all digits left foot. Neurologic:  Normal speech and language. No gross focal neurologic deficits are appreciated.  Skin:  Skin is warm, dry and intact. No rash noted.  Patient has an open, erythematous, edematous skin lesion to the right lower abdominal wall.  This is draining  purulent material.  Palpation reveals tenderness but no fluctuance or induration.  Palpation does not increase drainage.  Area measures approximately 7 cm in diameter. Psychiatric: Mood and affect are normal. Speech and behavior are normal. Patient exhibits appropriate insight and judgement.   ____________________________________________   LABS (all labs ordered are listed, but only abnormal results are displayed)  Labs Reviewed - No data to display ____________________________________________  EKG   ____________________________________________  RADIOLOGY   No results found.  ____________________________________________    PROCEDURES  Procedure(s) performed:    Procedures    Medications  predniSONE (DELTASONE) tablet 60 mg (has no administration in time range)  meloxicam (MOBIC) tablet 15 mg (has no administration in time range)     ____________________________________________   INITIAL IMPRESSION / ASSESSMENT AND PLAN / ED COURSE  Pertinent labs & imaging results that were available during my care of the patient were reviewed by me and considered in my medical decision making (see chart for details).  Review of the [place] CSRS was performed in accordance of the [facility] prior to  dispensing any controlled drugs.      Patient's diagnosis is consistent with gout, MRSA.  Patient presents to the emergency department with 2 separate complaints.  Exam findings are consistent with gout to the left fifth digit hand as well as first digit left foot.  Patient does not have insurance and cannot afford colchicine.  As such, patient will be placed on meloxicam and prednisone.  Patient also has draining wound consistent with MRSA to the right lower abdominal wall.  No indication for incision and drainage as area is already draining.  No indication of deep underlying abscess requiring imaging or labs..  Follow-up with primary care as needed.  Patient is given ED precautions to return to the ED for any worsening or new symptoms.     ____________________________________________  FINAL CLINICAL IMPRESSION(S) / ED DIAGNOSES  Final diagnoses:  Acute gout of multiple sites, unspecified cause  MRSA (methicillin resistant Staphylococcus aureus) infection      NEW MEDICATIONS STARTED DURING THIS VISIT:  ED Discharge Orders         Ordered    predniSONE (DELTASONE) 50 MG tablet  Daily with breakfast     08/20/18 1915    meloxicam (MOBIC) 15 MG tablet  Daily     08/20/18 1915    sulfamethoxazole-trimethoprim (BACTRIM DS,SEPTRA DS) 800-160 MG tablet  2 times daily     08/20/18 1915              This chart was dictated using voice recognition software/Dragon. Despite best efforts to proofread, errors can occur which can change the meaning. Any change was purely unintentional.    Racheal Patches, PA-C 08/20/18 1916    Emily Filbert, MD 08/20/18 2224

## 2019-12-05 IMAGING — CR DG CHEST 2V
1 series · 2 of 2 positions shown · non-contrast
Comparison: 11/26/2017

CLINICAL DATA: Chest pain

EXAM:
CHEST - 2 VIEW

[Series 1: dg chest 2 view · 0.14mm/px · 2 of 2 slices shown]
[im 1/2]
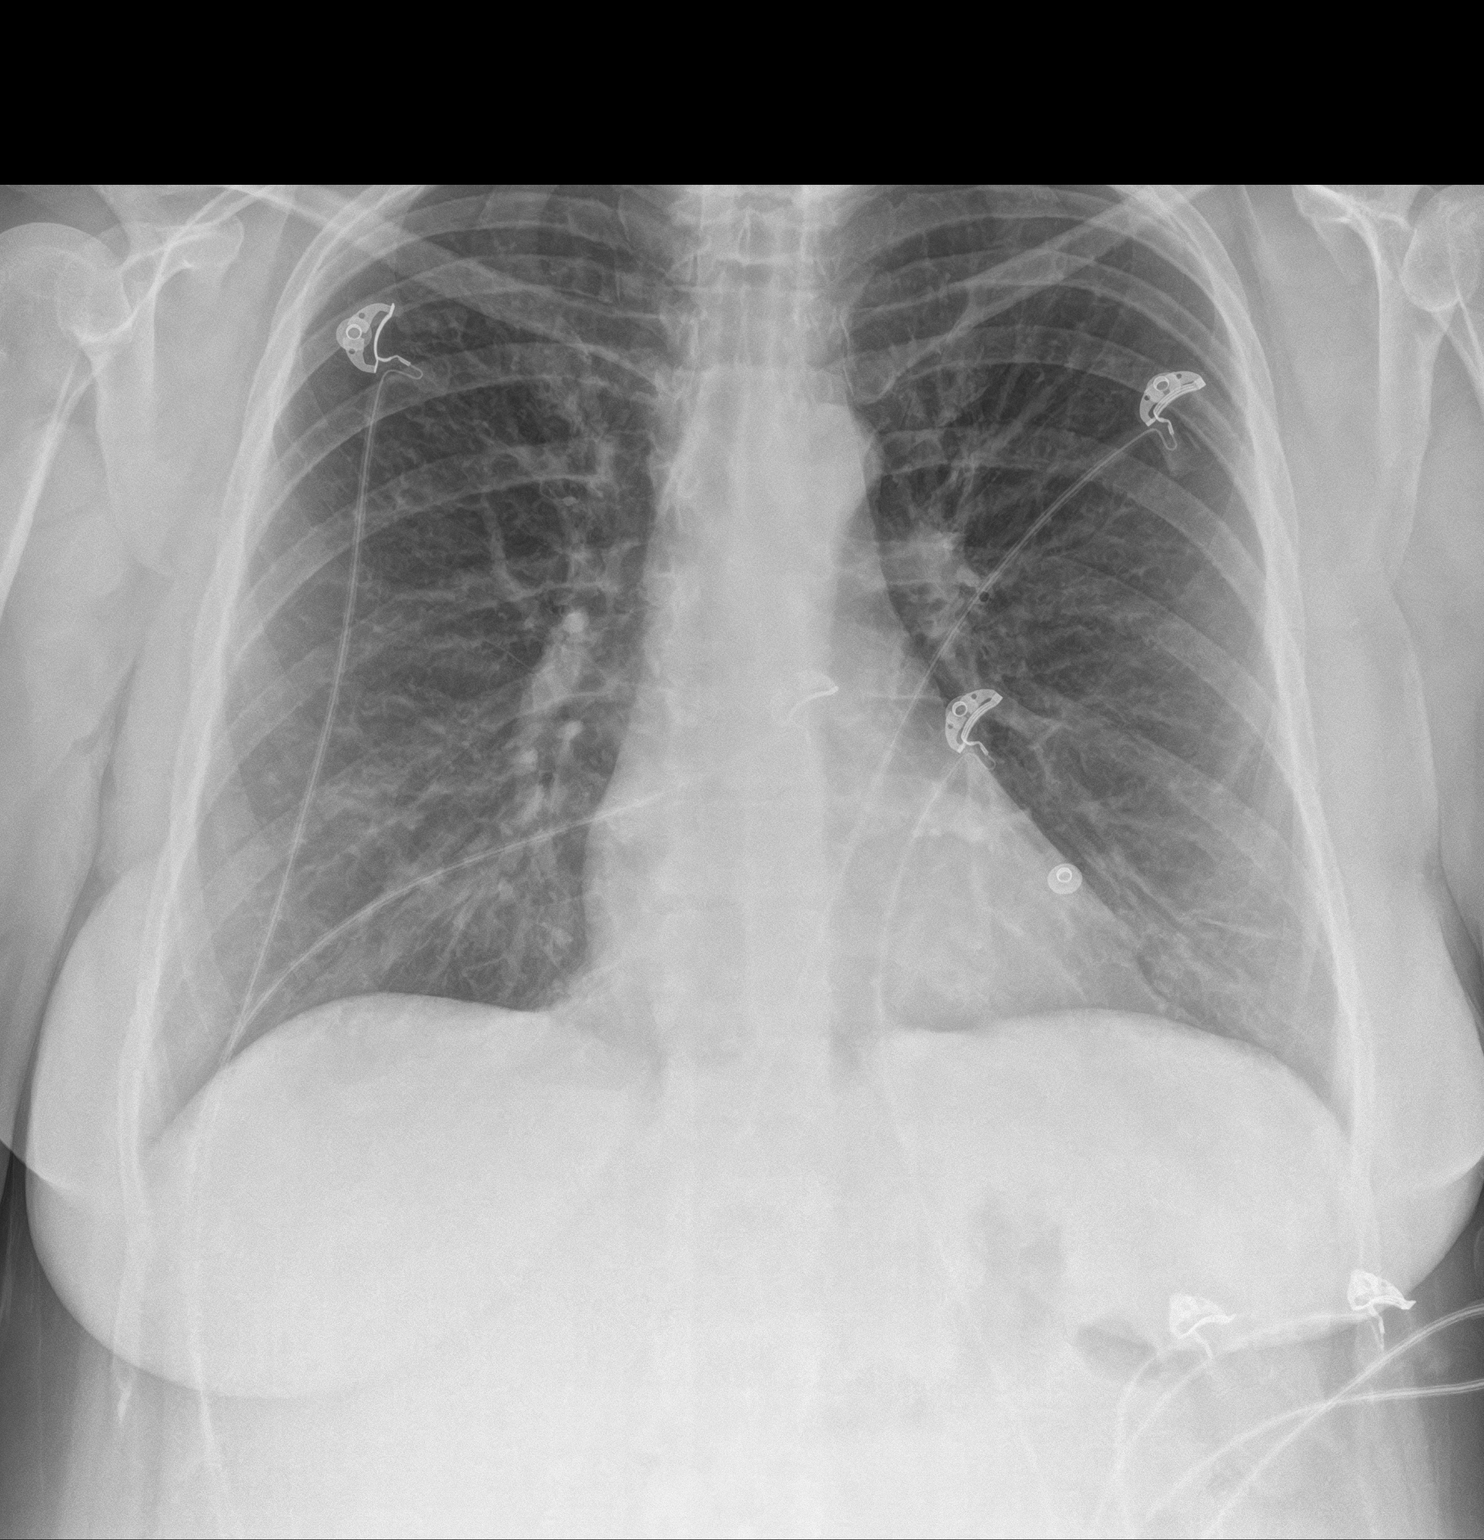
[im 2/2]
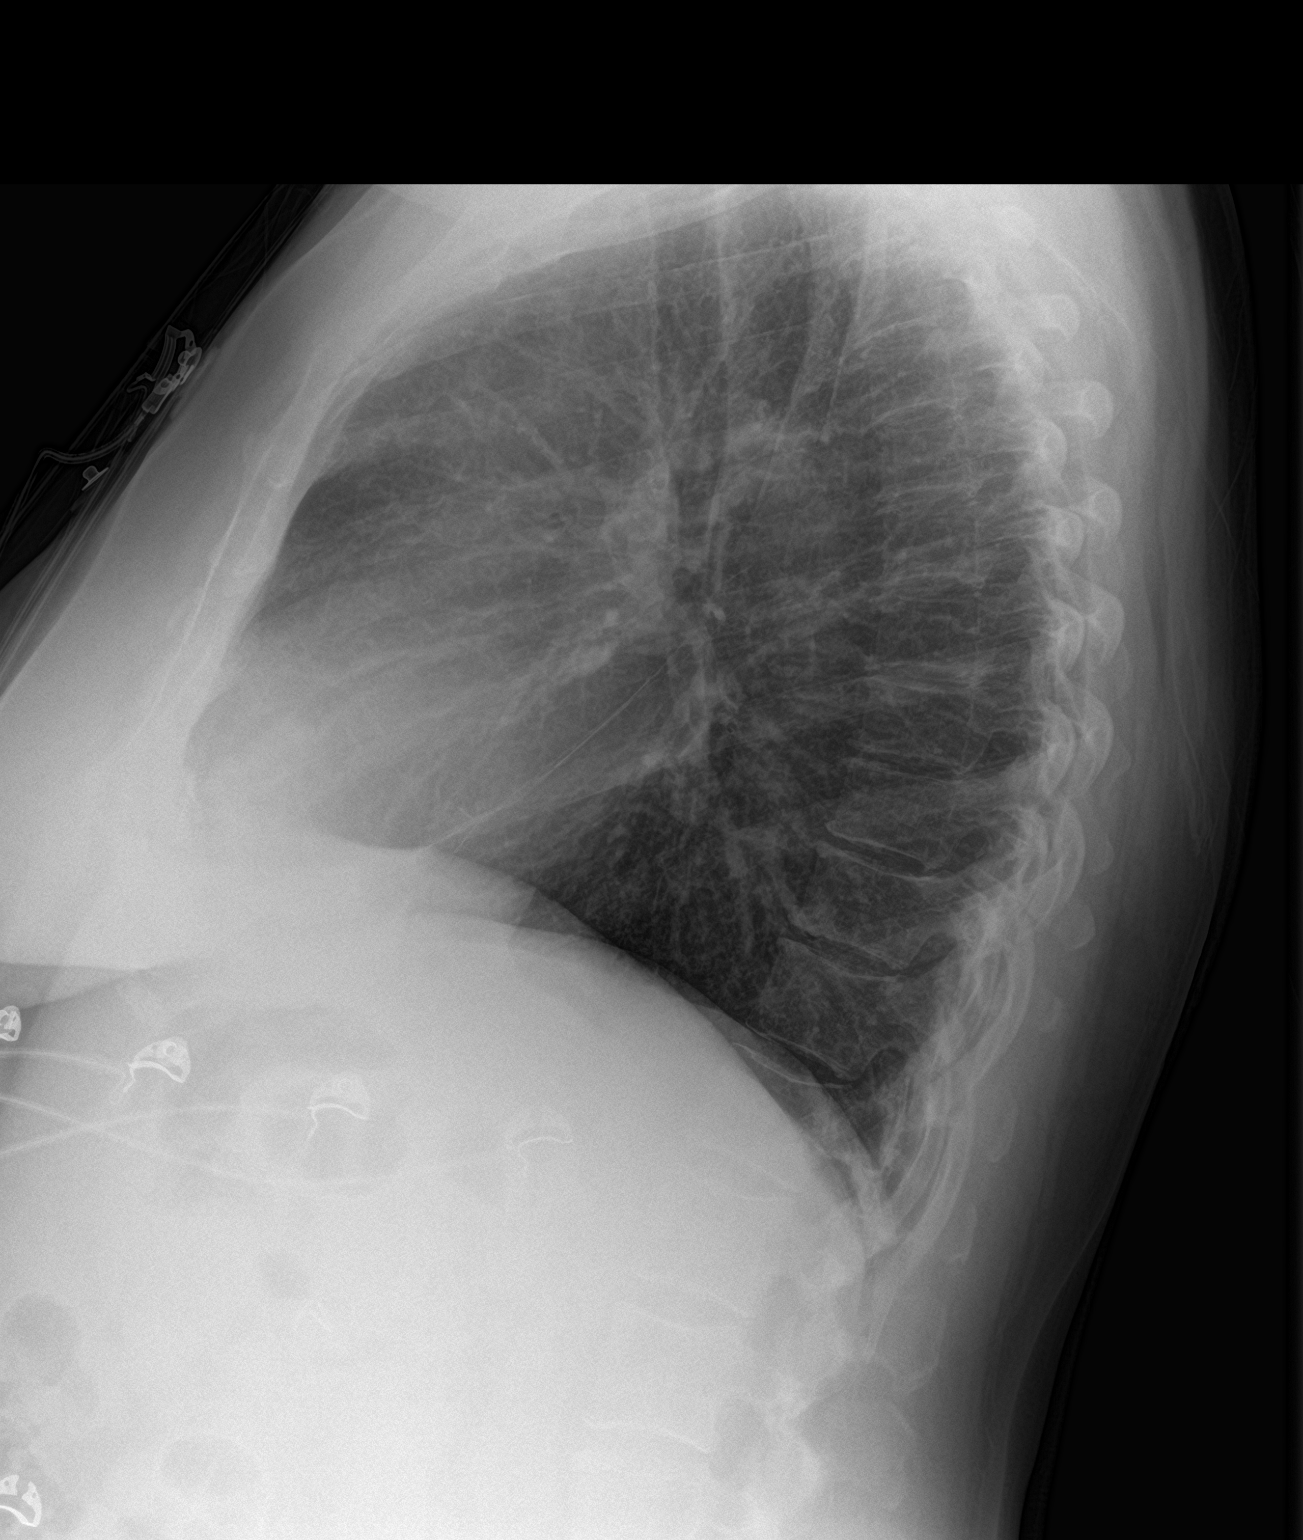

[2 of 2 positions shown; findings below may reference images not displayed]

FINDINGS: The heart size and mediastinal contours are within normal limits.
Both lungs are clear. The visualized skeletal structures are
unremarkable.
IMPRESSION: No active cardiopulmonary disease.

## 2020-01-04 ENCOUNTER — Ambulatory Visit: Attending: Nurse Practitioner | Primary: Nurse Practitioner

## 2020-01-04 ENCOUNTER — Ambulatory Visit
Admit: 2020-01-04 | Discharge: 2020-01-04 | Payer: MEDICAID | Attending: Nurse Practitioner | Primary: Nurse Practitioner

## 2020-01-04 DIAGNOSIS — Z7689 Persons encountering health services in other specified circumstances: Secondary | ICD-10-CM

## 2020-01-04 MED ORDER — ROSUVASTATIN 20 MG TAB
20 mg | ORAL_TABLET | Freq: Every evening | ORAL | 2 refills | Status: AC
Start: 2020-01-04 — End: ?

## 2020-01-04 MED ORDER — ISOSORBIDE MONONITRATE SR 60 MG 24 HR TAB
60 mg | ORAL_TABLET | Freq: Two times a day (BID) | ORAL | 1 refills | Status: AC
Start: 2020-01-04 — End: ?

## 2020-01-04 MED ORDER — NITROGLYCERIN 0.4 MG SUBLINGUAL TAB
0.4 mg | SUBLINGUAL | 5 refills | Status: AC | PRN
Start: 2020-01-04 — End: ?

## 2020-01-04 MED ORDER — CITALOPRAM 40 MG TAB
40 mg | ORAL_TABLET | Freq: Every day | ORAL | 1 refills | Status: AC
Start: 2020-01-04 — End: ?

## 2020-01-04 MED ORDER — PREDNISONE 20 MG TAB
20 mg | ORAL_TABLET | Freq: Every day | ORAL | 0 refills | Status: AC
Start: 2020-01-04 — End: 2020-01-09

## 2020-01-04 MED ORDER — CELECOXIB 200 MG CAP
200 mg | ORAL_CAPSULE | Freq: Every day | ORAL | 1 refills | Status: AC
Start: 2020-01-04 — End: ?

## 2020-01-04 MED ORDER — AMITRIPTYLINE 50 MG TAB
50 mg | ORAL_TABLET | Freq: Every evening | ORAL | 1 refills | Status: AC
Start: 2020-01-04 — End: ?

## 2020-01-04 MED ORDER — AMLODIPINE 10 MG TAB
10 mg | ORAL_TABLET | Freq: Every day | ORAL | 1 refills | Status: AC
Start: 2020-01-04 — End: ?

## 2020-01-04 NOTE — Patient Instructions (Signed)
Statins: Care Instructions  Your Care Instructions     Statins are medicines that lower your cholesterol and your risk for a heart attack and stroke.  Cholesterol is a type of fat in your blood. If you have too much cholesterol, it can build up in blood vessels. This raises your risk of heart disease, heart attack, and stroke.  Statins lower cholesterol by blocking how much your body makes. This prevents cholesterol from building up in your blood vessels. This is called hardening of the arteries. It is the starting point for some heart and blood flow problems, such as heart disease. Statins may also reduce inflammation around the buildup (called plaque). This can lower the risk that the plaque will break apart and lead to a heart attack or stroke.  A heart-healthy lifestyle is important for lowering your risk whether you take statins or not. This includes eating healthy foods, being active, staying at a healthy weight, and not smoking.  You must take statins regularly for them to work well. If you stop, your cholesterol and your risk will go back up.  Examples of statins include:  ?? Atorvastatin (Lipitor).  ?? Lovastatin (Mevacor).  ?? Pravastatin (Pravachol).  ?? Simvastatin (Zocor).  Statins interact with many medicines. So tell your doctor all of the other medicines that you take. These include prescription medicines, over-the-counter medicines, dietary supplements, and herbal products.  Follow-up care is a key part of your treatment and safety. Be sure to make and go to all appointments, and call your doctor if you are having problems. It's also a good idea to know your test results and keep a list of the medicines you take.  How can you care for yourself at home?  ?? Take statins exactly as your doctor tells you. High cholesterol has no symptoms. So it is easy to forget to take the pills. Try to make a system that reminds you to take them.  ?? Do not take two or more medicines at the same time unless the doctor  told you to. Statins can interact with other medicines.  ?? Always tell your doctor if you think you are having a side effect. If side effects are a problem with one medicine, a different one may be used.  ?? Keep making the lifestyle changes your doctor suggests. Eat heart-healthy foods, be active, don't smoke, and stay at a healthy weight.  ?? Talk to your doctor about avoiding grapefruit juice if you take statins. Grapefruit juice can raise the level of this medicine in your blood. This could increase side effects.  When should you call for help?  Watch closely for changes in your health, and be sure to contact your doctor if:  ?? ?? You think you are having problems with your medicine.   ?? ?? You have aches or muscle pain.   Where can you learn more?  Go to [email or web address]-rivera.info/  Enter R358 in the search box to learn more about "Statins: Care Instructions."  Current as of: May 15, 2019??????????????????????????????Content Version: 12.8  ?? 2006-2021 [employer].   Care instructions adapted under license by [employer] (which disclaims liability or warranty for this information). If you have questions about a medical condition or this instruction, always ask your healthcare professional. Lamar any warranty or liability for your use of this information.         Reducing Risk of Another Heart Attack With Medicine: Care Instructions  Your Care Instructions  After a heart attack, medicines help lower your risk of having another one. These medicines include:  ?? ACE inhibitors or ARBs. These are types of blood pressure medicines.  ?? Statins and other cholesterol medicines. These lower cholesterol.  ?? Aspirin and other antiplatelets. These medicines prevent blood clots from forming in your blood vessels. This can help prevent a heart attack.  ?? Beta-blocker medicines. These are a type of blood pressure and heart medicine.  All medicines can cause side effects.  So it is important to understand the pros and cons of any medicine you take. It is also important to take your medicines exactly as your doctor tells you to.  Follow-up care is a key part of your treatment and safety. Be sure to make and go to all appointments, and call your doctor if you are having problems. It's also a good idea to know your test results and keep a list of the medicines you take.  ACE inhibitors  ACE (angiotensin-converting enzyme) inhibitors are used for three main reasons. They lower blood pressure. They protect the kidneys. And they prevent heart attacks and strokes. Examples include:  ?? Benazepril (Lotensin).  ?? Lisinopril (Prinivil).  ?? Ramipril (Altace).  An angiotensin II receptor blocker (ARB) may be used instead of an ACE inhibitor. ARBs help you in the same ways as ACE inhibitors. Examples include:  ?? Candesartan (Atacand).  ?? Irbesartan (Avapro).  ?? Losartan (Cozaar).  Before you start taking an ACE inhibitor or an ARB, make sure your doctor knows if you:  ?? Take water pills (diuretics).  ?? Take potassium pills or use salt substitutes.  ?? Are pregnant or breastfeeding.  ?? Had a kidney transplant or other kidney problems.  ACE inhibitors and ARBs can cause side effects. Call your doctor right away if you have:  ?? Trouble breathing.  ?? Swelling in your face, head, neck, or tongue.  Statins  Statins can help lower your risk for a heart attack and stroke. This medicine lowers your cholesterol. Examples include:  ?? Atorvastatin (Lipitor).  ?? Lovastatin (Mevacor).  ?? Pravastatin (Pravachol).  ?? Simvastatin (Zocor).  Before you start taking a statin, talk to your doctor. Make sure your doctor knows if:  ?? You have had a kidney transplant or other kidney problems.  ?? You have liver disease.  ?? You take any other prescription medicine, over-the-counter medicine, vitamins, supplements, or herbal remedies.  ?? You are pregnant or breastfeeding.  Statins can cause side effects. Call your doctor  right away if you have:  ?? New, severe muscle aches.  ?? Brown urine.  Aspirin  After a heart attack, aspirin can help lower your risk of having another one. Most heart attacks are caused by a blood clot that blocks a coronary artery. When this happens, oxygen can't get to the heart muscle, and part of the heart dies. Aspirin can help prevent blood clots that can block the blood vessels.  You may not be able to use aspirin if you:  ?? Have asthma or certain other health conditions.  ?? Have an ulcer or other stomach problem.  ?? Take some other medicine (called a blood thinner) that prevents blood clots.  ?? Are allergic to aspirin.  Your doctor may recommend that you take one low-dose aspirin (81 mg) tablet each day, with a meal and a full glass of water.  Aspirin can also cause serious bleeding. Be sure you get instructions about how to take aspirin safely.  Call your  doctor right away if you have:  ?? Unusual bleeding.  ?? Nausea, vomiting, or heartburn.  ?? Black or bloody stools.  Beta-blockers  Beta-blockers are used for three main reasons. They lower blood pressure. They relieve angina symptoms (such as chest pain or pressure). And they reduce the chances of a second heart attack. They include:  ?? Atenolol (Tenormin).  ?? Carvedilol (Coreg).  ?? Metoprolol (Lopressor).  Before you start taking a beta-blocker, make sure your doctor knows if you have:  ?? Severe asthma or frequent asthma attacks.  ?? A very slow pulse. (This is less than 55 beats a minute.)  Beta-blockers can cause side effects. Call your doctor right away if you:  ?? Wheeze or have trouble breathing.  ?? Feel dizzy or lightheaded.  ?? Have asthma that gets worse.  When should you call for help?  Watch closely for changes in your health, and be sure to contact your doctor if you have any problems.  Where can you learn more?  Go to [email or web address]  Enter R428 in the search box to learn more about "Reducing Risk of Another Heart  Attack With Medicine: Care Instructions."  Current as of: May 15, 2019??????????????????????????????Content Version: 12.8  ?? 2006-2021 [employer], [employer].   Care instructions adapted under license by [employer] (which disclaims liability or warranty for this information). If you have questions about a medical condition or this instruction, always ask your healthcare professional. [employer] disclaims any warranty or liability for your use of this information.

## 2020-01-04 NOTE — Progress Notes (Signed)
Chief Complaint   Patient presents with   ??? Establish Care     just moved here from [place] - was seeing a free clinic there   ??? Hand Pain     right hand - radiates into upper right arm     No flowsheet data found.    3 most recent PHQ Screens 01/04/2020   Little interest or pleasure in doing things Not at all   Feeling down, depressed, irritable, or hopeless Not at all   Total Score PHQ 2 0       Abuse Screening Questionnaire 01/04/2020   Do you ever feel afraid of your partner? N   Are you in a relationship with someone who physically or mentally threatens you? N   Is it safe for you to go home? Y       ADL Assessment 01/04/2020   Feeding yourself No Help Needed   Getting from bed to chair No Help Needed   Getting dressed No Help Needed   Bathing or showering No Help Needed   Walk across the room (includes cane/walker) No Help Needed   Using the telphone No Help Needed   Taking your medications No Help Needed   Preparing meals No Help Needed   Managing money (expenses/bills) No Help Needed   Moderately strenuous housework (laundry) No Help Needed   Shopping for personal items (toiletries/medicines) No Help Needed   Shopping for groceries No Help Needed   Driving No Help Needed   Climbing a flight of stairs No Help Needed   Getting to places beyond walking distances No Help Needed

## 2020-01-04 NOTE — Progress Notes (Signed)
HPI:  Chief Complaint   Patient presents with   ??? Establish Care     just moved here from [facility] - was seeing a free clinic there   ??? Hand Pain     right hand - radiates into upper right arm     Mariah George is a 49 y.o. year old female who is a new patient and is here to establish care.  She was previous followed by [facility].    Moved from [place], [place] near [place]. Left a bad relationship. Parents are here.   Just got Medicaid.     The following sections were reviewed & updated as appropriate: PMH, PL, PSH, and SH.      Smokes 1 pack q 3 days.   Has had 4 myocardial infarctions. Most recent 2019.   Works at [employer].   Does not eat a lot of salt    Stress incontinence.  In menopause.  Takes an albuterol inhaler    Right wrist  3 fingers numb.    Left hip pain.    There is no problem list on file for this patient.     Prior to Admission medications    Medication Sig Start Date End Date Taking? Authorizing Provider   amLODIPine (NORVASC) 10 mg tablet Take 10 mg by mouth daily.   Yes Provider, Historical   isosorbide mononitrate ER (IMDUR) 60 mg CR tablet Take 60 mg by mouth two (2) times a day.   Yes Provider, Historical   celecoxib (CeleBREX) 200 mg capsule Take 200 mg by mouth daily.   Yes Provider, Historical   nitroglycerin (NITROSTAT) 0.4 mg SL tablet 0.4 mg by SubLINGual route every five (5) minutes as needed for Chest Pain. Up to 3 doses.   Yes Provider, Historical   amitriptyline (ELAVIL) 50 mg tablet Take 100 mg by mouth nightly.   Yes Provider, Historical   citalopram (CELEXA) 40 mg tablet Take 40 mg by mouth daily.   Yes Provider, Historical      No Known Allergies      Review of Systems   Constitutional: Negative for chills and fever.   Respiratory: Positive for wheezing. Negative for shortness of breath.    Cardiovascular: Negative for chest pain.   Gastrointestinal: Negative for nausea and vomiting.   Genitourinary: Negative for dysuria, frequency and urgency.   Neurological: Negative for dizziness and  headaches.     Physical Exam     Visit Vitals  BP 129/81 (BP 1 Location: Left upper arm, BP Patient Position: At rest, BP Cuff Size: Small adult)   Pulse 87   Temp 97.5 ??F (36.4 ??C) (Oral)   Resp 24   Ht 5' 5.75" (1.67 m)   Wt 216 lb (98 kg)   LMP  (Approximate) Comment: 4 years ago   SpO2 98%   BMI 35.13 kg/m??     Gen: alert, oriented, no acute distress  Head: normocephalic, atraumatic  Ears: external auditory canals clear, TMs without erythema or effusion  Eyes: pupils equal round reactive to light, sclera clear, conjunctiva clear  Nose: normal turbinates, no rhinorrhea  Oral: moist mucus membranes, no oral lesions, no pharyngeal inflammation or exudate; tongue red  Neck: supple, + lymphadenopathy  Resp: no increased work of breathing, lungs WHEEZING ANT AND POST THROUGHOUT  to ausculation bilaterally  CV: S1, S2 normal, no murmurs, rubs, or gallops.    Abd: soft, not tender, not distended.  No hepatosplenomegaly. Normal bowel sounds.  No hernias.  Neuro: cranial nerves intact, normal strength and movement in all extremities, reflexes and sensation intact and symmetric.  Skin: no lesion or rash  CARPAL TUNNEL -PHALENS TEST POSITIVE    Assessment & Plan:  Differential diagnosis and treatment options reviewed with patient who is in agreement with treatment plan as outlined below.    ICD-10-CM ICD-9-CM    1. Encounter to establish care  Z76.89 V65.8 VITAMIN B12      VITAMIN D, 25 HYDROXY      URINALYSIS W/ REFLEX CULTURE      VITAMIN B12      VITAMIN D, 25 HYDROXY      URINALYSIS W/ REFLEX CULTURE   2. Essential hypertension  I10 401.9 amLODIPine (NORVASC) 10 mg tablet      METABOLIC PANEL, COMPREHENSIVE      CBC WITH AUTOMATED DIFF      HEMOGLOBIN A1C WITH EAG      IRON PROFILE      MAGNESIUM      TSH 3RD GENERATION      T4, FREE      METABOLIC PANEL, COMPREHENSIVE      CBC WITH AUTOMATED DIFF      HEMOGLOBIN A1C WITH EAG      IRON PROFILE      MAGNESIUM      TSH 3RD GENERATION      T4, FREE   3. Hyperlipidemia,  unspecified hyperlipidemia type  E78.5 272.4 rosuvastatin (CRESTOR) 20 mg tablet      LIPID PANEL      LIPID PANEL   4. Depression, unspecified depression type  F32.9 311 amitriptyline (ELAVIL) 50 mg tablet      citalopram (CELEXA) 40 mg tablet   5. Hx of myocardial infarction  I25.2 412 isosorbide mononitrate ER (IMDUR) 60 mg CR tablet      nitroglycerin (NITROSTAT) 0.4 mg SL tablet      REFERRAL TO CARDIOLOGY   6. Arthritis  M19.90 716.90 celecoxib (CeleBREX) 200 mg capsule   7. Wheezing  R06.2 786.07 predniSONE (DELTASONE) 20 mg tablet       1. Encounter to establish care  - VITAMIN B12; Future  - VITAMIN D, 25 HYDROXY; Future  - URINALYSIS W/ REFLEX CULTURE; Future  - VITAMIN B12  - VITAMIN D, 25 HYDROXY  - URINALYSIS W/ REFLEX CULTURE    2. Essential hypertension  - amLODIPine (NORVASC) 10 mg tablet; Take 1 Tab by mouth daily.  Dispense: 90 Tab; Refill: 1  - METABOLIC PANEL, COMPREHENSIVE; Future  - CBC WITH AUTOMATED DIFF; Future  - HEMOGLOBIN A1C WITH EAG; Future  - IRON PROFILE; Future  - MAGNESIUM; Future  - TSH 3RD GENERATION; Future  - T4, FREE; Future  - METABOLIC PANEL, COMPREHENSIVE  - CBC WITH AUTOMATED DIFF  - HEMOGLOBIN A1C WITH EAG  - IRON PROFILE  - MAGNESIUM  - TSH 3RD GENERATION  - T4, FREE    3. Hyperlipidemia, unspecified hyperlipidemia type  - rosuvastatin (CRESTOR) 20 mg tablet; Take 1 Tab by mouth nightly. Indications: excessive fat in the blood  Dispense: 90 Tab; Refill: 2  - LIPID PANEL; Future  - LIPID PANEL    4. Depression, unspecified depression type  - amitriptyline (ELAVIL) 50 mg tablet; Take 2 Tabs by mouth nightly.  Dispense: 90 Tab; Refill: 1  - citalopram (CELEXA) 40 mg tablet; Take 1 Tab by mouth daily.  Dispense: 90 Tab; Refill: 1    5. Hx of myocardial infarction  - isosorbide mononitrate ER (IMDUR) 60 mg  CR tablet; Take 1 Tab by mouth two (2) times a day.  Dispense: 180 Tab; Refill: 1  - nitroglycerin (NITROSTAT) 0.4 mg SL tablet; 1 Tab by SubLINGual route every five (5)  minutes as needed for Chest Pain. Up to 3 doses.  Dispense: 1 Bottle; Refill: 5  - REFERRAL TO CARDIOLOGY    6. Arthritis  - celecoxib (CeleBREX) 200 mg capsule; Take 1 Cap by mouth daily.  Dispense: 90 Cap; Refill: 1    7. Wheezing  - predniSONE (DELTASONE) 20 mg tablet; Take 40 mg by mouth daily (with breakfast) for 5 days.  Dispense: 10 Tab; Refill: 0  Brace    Labs pending.  Renewed meds.  Steroid for 5 days.  Call cardiology and make apt to establish care.  Follow up 2 weeks.    Verbal and written instructions (see AVS) provided.  Patient expresses understanding and agreement of diagnosis and treatment plan.  If symptoms worsen and  necessary, call 911 or go to nearest ER.     Winferd Wease A Chace Bisch, NP

## 2020-01-04 NOTE — Progress Notes (Signed)
HPI:  Chief Complaint   Patient presents with   ??? Establish Care     just moved here from [facility] - was seeing a free clinic there   ??? Hand Pain     right hand - radiates into upper right arm     Mariah George is a 49 y.o. year old female who is a new patient and is here to establish care.  She was previous followed by [facility].    Moved from [place], [place] near [place]. Left a bad relationship. Parents are here.   Just got Medicaid.     The following sections were reviewed & updated as appropriate: PMH, PL, PSH, and SH.      Smokes 1 pack q 3 days.   Has had 4 myocardial infarctions. Most recent 2019.   Works at [employer].   Does not eat a lot of salt    Stress incontinence.  In menopause.  Takes an albuterol inhaler    Right wrist  3 fingers numb.    Left hip pain.    There is no problem list on file for this patient.     Prior to Admission medications    Medication Sig Start Date End Date Taking? Authorizing Provider   amLODIPine (NORVASC) 10 mg tablet Take 10 mg by mouth daily.   Yes Provider, Historical   isosorbide mononitrate ER (IMDUR) 60 mg CR tablet Take 60 mg by mouth two (2) times a day.   Yes Provider, Historical   celecoxib (CeleBREX) 200 mg capsule Take 200 mg by mouth daily.   Yes Provider, Historical   nitroglycerin (NITROSTAT) 0.4 mg SL tablet 0.4 mg by SubLINGual route every five (5) minutes as needed for Chest Pain. Up to 3 doses.   Yes Provider, Historical   amitriptyline (ELAVIL) 50 mg tablet Take 100 mg by mouth nightly.   Yes Provider, Historical   citalopram (CELEXA) 40 mg tablet Take 40 mg by mouth daily.   Yes Provider, Historical      No Known Allergies      Review of Systems   Constitutional: Negative for chills and fever.   Respiratory: Positive for wheezing. Negative for shortness of breath.    Cardiovascular: Negative for chest pain.   Gastrointestinal: Negative for nausea and vomiting.   Genitourinary: Negative for dysuria, frequency and urgency.   Neurological: Negative for dizziness and  headaches.     Physical Exam     Visit Vitals  BP 129/81 (BP 1 Location: Left upper arm, BP Patient Position: At rest, BP Cuff Size: Small adult)   Pulse 87   Temp 97.5 ??F (36.4 ??C) (Oral)   Resp 24   Ht 5' 5.75" (1.67 m)   Wt 216 lb (98 kg)   LMP  (Approximate) Comment: 4 years ago   SpO2 98%   BMI 35.13 kg/m??     Gen: alert, oriented, no acute distress  Head: normocephalic, atraumatic  Ears: external auditory canals clear, TMs without erythema or effusion  Eyes: pupils equal round reactive to light, sclera clear, conjunctiva clear  Nose: normal turbinates, no rhinorrhea  Oral: moist mucus membranes, no oral lesions, no pharyngeal inflammation or exudate; tongue red  Neck: supple, + lymphadenopathy  Resp: no increased work of breathing, lungs WHEEZING ANT AND POST THROUGHOUT  to ausculation bilaterally  CV: S1, S2 normal, no murmurs, rubs, or gallops.    Abd: soft, not tender, not distended.  No hepatosplenomegaly. Normal bowel sounds.  No hernias.  Neuro: cranial nerves intact, normal strength and movement in all extremities, reflexes and sensation intact and symmetric.  Skin: no lesion or rash  CARPAL TUNNEL -PHALENS TEST POSITIVE    Assessment & Plan:  Differential diagnosis and treatment options reviewed with patient who is in agreement with treatment plan as outlined below.    ICD-10-CM ICD-9-CM    1. Encounter to establish care  Z76.89 V65.8 VITAMIN B12      VITAMIN D, 25 HYDROXY      URINALYSIS W/ REFLEX CULTURE      VITAMIN B12      VITAMIN D, 25 HYDROXY      URINALYSIS W/ REFLEX CULTURE   2. Essential hypertension  I10 401.9 amLODIPine (NORVASC) 10 mg tablet      METABOLIC PANEL, COMPREHENSIVE      CBC WITH AUTOMATED DIFF      HEMOGLOBIN A1C WITH EAG      IRON PROFILE      MAGNESIUM      TSH 3RD GENERATION      T4, FREE      METABOLIC PANEL, COMPREHENSIVE      CBC WITH AUTOMATED DIFF      HEMOGLOBIN A1C WITH EAG      IRON PROFILE      MAGNESIUM      TSH 3RD GENERATION      T4, FREE   3. Hyperlipidemia,  unspecified hyperlipidemia type  E78.5 272.4 rosuvastatin (CRESTOR) 20 mg tablet      LIPID PANEL      LIPID PANEL   4. Depression, unspecified depression type  F32.9 311 amitriptyline (ELAVIL) 50 mg tablet      citalopram (CELEXA) 40 mg tablet   5. Hx of myocardial infarction  I25.2 412 isosorbide mononitrate ER (IMDUR) 60 mg CR tablet      nitroglycerin (NITROSTAT) 0.4 mg SL tablet      REFERRAL TO CARDIOLOGY   6. Arthritis  M19.90 716.90 celecoxib (CeleBREX) 200 mg capsule   7. Wheezing  R06.2 786.07 predniSONE (DELTASONE) 20 mg tablet       1. Encounter to establish care  - VITAMIN B12; Future  - VITAMIN D, 25 HYDROXY; Future  - URINALYSIS W/ REFLEX CULTURE; Future  - VITAMIN B12  - VITAMIN D, 25 HYDROXY  - URINALYSIS W/ REFLEX CULTURE    2. Essential hypertension  - amLODIPine (NORVASC) 10 mg tablet; Take 1 Tab by mouth daily.  Dispense: 90 Tab; Refill: 1  - METABOLIC PANEL, COMPREHENSIVE; Future  - CBC WITH AUTOMATED DIFF; Future  - HEMOGLOBIN A1C WITH EAG; Future  - IRON PROFILE; Future  - MAGNESIUM; Future  - TSH 3RD GENERATION; Future  - T4, FREE; Future  - METABOLIC PANEL, COMPREHENSIVE  - CBC WITH AUTOMATED DIFF  - HEMOGLOBIN A1C WITH EAG  - IRON PROFILE  - MAGNESIUM  - TSH 3RD GENERATION  - T4, FREE    3. Hyperlipidemia, unspecified hyperlipidemia type  - rosuvastatin (CRESTOR) 20 mg tablet; Take 1 Tab by mouth nightly. Indications: excessive fat in the blood  Dispense: 90 Tab; Refill: 2  - LIPID PANEL; Future  - LIPID PANEL    4. Depression, unspecified depression type  - amitriptyline (ELAVIL) 50 mg tablet; Take 2 Tabs by mouth nightly.  Dispense: 90 Tab; Refill: 1  - citalopram (CELEXA) 40 mg tablet; Take 1 Tab by mouth daily.  Dispense: 90 Tab; Refill: 1    5. Hx of myocardial infarction  - isosorbide mononitrate ER (IMDUR) 60 mg  CR tablet; Take 1 Tab by mouth two (2) times a day.  Dispense: 180 Tab; Refill: 1  - nitroglycerin (NITROSTAT) 0.4 mg SL tablet; 1 Tab by SubLINGual route every five (5)  minutes as needed for Chest Pain. Up to 3 doses.  Dispense: 1 Bottle; Refill: 5  - REFERRAL TO CARDIOLOGY    6. Arthritis  - celecoxib (CeleBREX) 200 mg capsule; Take 1 Cap by mouth daily.  Dispense: 90 Cap; Refill: 1    7. Wheezing  - predniSONE (DELTASONE) 20 mg tablet; Take 40 mg by mouth daily (with breakfast) for 5 days.  Dispense: 10 Tab; Refill: 0  Brace    Labs pending.  Renewed meds.  Steroid for 5 days.  Call cardiology and make apt to establish care.  Follow up 2 weeks.    Verbal and written instructions (see AVS) provided.  Patient expresses understanding and agreement of diagnosis and treatment plan.  If symptoms worsen and  necessary, call 911 or go to nearest ER.     Lovie Macadamia, NP

## 2020-01-04 NOTE — Progress Notes (Signed)
 Chief Complaint   Patient presents with   . Establish Care     just moved here from [facility] - was seeing a free clinic there   . Hand Pain     right hand - radiates into upper right arm     No flowsheet data found.    3 most recent PHQ Screens 01/04/2020   Little interest or pleasure in doing things Not at all   Feeling down, depressed, irritable, or hopeless Not at all   Total Score PHQ 2 0       Abuse Screening Questionnaire 01/04/2020   Do you ever feel afraid of your partner? N   Are you in a relationship with someone who physically or mentally threatens you? N   Is it safe for you to go home? Y       ADL Assessment 01/04/2020   Feeding yourself No Help Needed   Getting from bed to chair No Help Needed   Getting dressed No Help Needed   Bathing or showering No Help Needed   Walk across the room (includes cane/walker) No Help Needed   Using the telphone No Help Needed   Taking your medications No Help Needed   Preparing meals No Help Needed   Managing money (expenses/bills) No Help Needed   Moderately strenuous housework (laundry) No Help Needed   Shopping for personal items (toiletries/medicines) No Help Needed   Shopping for groceries No Help Needed   Driving No Help Needed   Climbing a flight of stairs No Help Needed   Getting to places beyond walking distances No Help Needed

## 2020-01-08 NOTE — Telephone Encounter (Signed)
Message fr envera        Best contact number(s):[phone]     Is patient out of this medication (yes/no) Yes ; Clebrex 200 mg    Pharmacy name:[id number] - [place], [place] - [address] [place] [address]     Details to clarify the request: The patient said that she need to have Authorization for Medication per the Pharmacy to e refilled.

## 2020-01-09 NOTE — Telephone Encounter (Signed)
01/09/20 [place] [id number] [place] for Celecoxib 200 mg, override was put in for 90 day supply, by plan pharmacy [facility]..Please note, this is a managed account ,new health plan to start May 1st, may not be on the new plan formulary.Please follow up with patient for update in new plan insurance information, .[facility] was called Brett Canales was advised new plan to start 1st of May 2021.Brett Canales state claim went thru,verified understanding.

## 2020-01-12 LAB — COMPREHENSIVE METABOLIC PANEL
ALT: 13 IU/L (ref 0–32)
AST: 12 IU/L (ref 0–40)
Albumin/Globulin Ratio: 1.7 NA (ref 1.2–2.2)
Albumin: 4.3 g/dL (ref 3.8–4.8)
Alkaline Phosphatase: 97 IU/L (ref 39–117)
BUN: 17 mg/dL (ref 6–24)
Bun/Cre Ratio: 21 NA (ref 9–23)
CO2: 28 mmol/L (ref 20–29)
Calcium: 8.9 mg/dL (ref 8.7–10.2)
Chloride: 103 mmol/L (ref 96–106)
Creatinine: 0.81 mg/dL (ref 0.57–1.00)
EGFR IF NonAfrican American: 86 mL/min/{1.73_m2} (ref >59)
GFR African American: 99 mL/min/{1.73_m2} (ref >59)
Globulin, Total: 2.6 g/dL (ref 1.5–4.5)
Glucose: 107 mg/dL — ABNORMAL HIGH (ref 65–99)
Potassium: 4.5 mmol/L (ref 3.5–5.2)
Sodium: 143 mmol/L (ref 134–144)
Total Bilirubin: 0.2 mg/dL (ref 0.0–1.2)
Total Protein: 6.9 g/dL (ref 6.0–8.5)

## 2020-01-12 LAB — URINALYSIS W/ REFLEX CULTURE
Bilirubin, Urine: NEGATIVE
Bilirubin: NEGATIVE
Blood, Urine: NEGATIVE
Blood: NEGATIVE
Ketone: NEGATIVE
Ketones, Urine: NEGATIVE
Leukocyte Esterase, Urine: NEGATIVE
Leukocyte Esterase: NEGATIVE
Nitrite, Urine: NEGATIVE
Nitrites: NEGATIVE
Specific Gravity, UA: 1.02 NA (ref 1.005–1.030)
Specific Gravity: 1.02 (ref 1.005–1.030)
Urobilinogen, Urine: 1 mg/dL (ref 0.2–1.0)
Urobilinogen: 1 mg/dL (ref 0.2–1.0)
pH (UA): 6.5 (ref 5.0–7.5)
pH, UA: 6.5 NA (ref 5.0–7.5)

## 2020-01-12 LAB — HEMOGLOBIN A1C W/EAG
Hemoglobin A1C: 5.9 % — ABNORMAL HIGH (ref 4.8–5.6)
eAG: 123 mg/dL

## 2020-01-12 LAB — CBC WITH AUTO DIFFERENTIAL
Basophils %: 1 %
Basophils Absolute: 0.1 10*3/uL (ref 0.0–0.2)
Eosinophils %: 5 %
Eosinophils Absolute: 0.5 10*3/uL — ABNORMAL HIGH (ref 0.0–0.4)
Granulocyte Absolute Count: 0.1 10*3/uL (ref 0.0–0.1)
Hematocrit: 40.1 % (ref 34.0–46.6)
Hemoglobin: 13 g/dL (ref 11.1–15.9)
Immature Granulocytes: 1 %
Lymphocytes %: 23 %
Lymphocytes Absolute: 2 10*3/uL (ref 0.7–3.1)
MCH: 27.7 pg (ref 26.6–33.0)
MCHC: 32.4 g/dL (ref 31.5–35.7)
MCV: 85 fL (ref 79–97)
Monocytes %: 5 %
Monocytes Absolute: 0.5 10*3/uL (ref 0.1–0.9)
Neutrophils %: 65 %
Neutrophils Absolute: 5.9 10*3/uL (ref 1.4–7.0)
Platelets: 314 10*3/uL (ref 150–450)
RBC: 4.7 x10E6/uL (ref 3.77–5.28)
RDW: 14.3 % (ref 11.7–15.4)
WBC: 8.9 10*3/uL (ref 3.4–10.8)

## 2020-01-12 LAB — LIPID PANEL
Cholesterol, Total: 159 mg/dL (ref 100–199)
Cholesterol, total: 159 mg/dL (ref 100–199)
HDL Cholesterol: 50 mg/dL (ref >39)
HDL: 50 mg/dL (ref >39)
LDL Calculated: 80 mg/dL (ref 0–99)
LDL, calculated: 80 mg/dL (ref 0–99)
Triglyceride: 173 mg/dL — ABNORMAL HIGH (ref 0–149)
Triglycerides: 173 mg/dL — ABNORMAL HIGH (ref 0–149)
VLDL, calculated: 29 mg/dL (ref 5–40)
VLDL: 29 mg/dL (ref 5–40)

## 2020-01-12 LAB — CVD REPORT

## 2020-01-12 LAB — TSH 3RD GENERATION
TSH: 2.62 u[IU]/mL (ref 0.450–4.500)
TSH: 2.62 u[IU]/mL (ref 0.450–4.500)

## 2020-01-12 LAB — MICROSCOPIC EXAMINATION
Casts UA: NONE SEEN /lpf
Casts: NONE SEEN /lpf
Epithelial Cells, UA: >10 /hpf — AB (ref 0–10)
Epithelial cells: >10 /hpf — AB (ref 0–10)

## 2020-01-12 LAB — IRON AND TIBC
Iron Saturation: 14 % — ABNORMAL LOW (ref 15–55)
Iron: 51 ug/dL (ref 27–159)
TIBC: 361 ug/dL (ref 250–450)
UIBC: 310 ug/dL (ref 131–425)

## 2020-01-12 LAB — VITAMIN B12
Vitamin B-12: 511 pg/mL (ref 232–1245)
Vitamin B12: 511 pg/mL (ref 232–1245)

## 2020-01-12 LAB — VITAMIN D 25 HYDROXY: Vit D, 25-Hydroxy: 29.6 ng/mL — ABNORMAL LOW (ref 30.0–100.0)

## 2020-01-12 LAB — MAGNESIUM
Magnesium: 2.2 mg/dL (ref 1.6–2.3)
Magnesium: 2.2 mg/dL (ref 1.6–2.3)

## 2020-01-12 LAB — T4, FREE
T4 Free: 1.24 ng/dL (ref 0.82–1.77)
T4, Free: 1.24 ng/dL (ref 0.82–1.77)

## 2020-01-12 LAB — CBC WITH AUTOMATED DIFF
ABS. BASOPHILS: 0.1 10*3/uL (ref 0.0–0.2)
ABS. EOSINOPHILS: 0.5 10*3/uL — ABNORMAL HIGH (ref 0.0–0.4)
ABS. IMM. GRANS.: 0.1 10*3/uL (ref 0.0–0.1)
ABS. MONOCYTES: 0.5 10*3/uL (ref 0.1–0.9)
ABS. NEUTROPHILS: 5.9 10*3/uL (ref 1.4–7.0)
Abs Lymphocytes: 2 10*3/uL (ref 0.7–3.1)
BASOPHILS: 1 %
EOSINOPHILS: 5 %
HCT: 40.1 % (ref 34.0–46.6)
HGB: 13 g/dL (ref 11.1–15.9)
IMMATURE GRANULOCYTES: 1 %
Lymphocytes: 23 %
MCH: 27.7 pg (ref 26.6–33.0)
MCHC: 32.4 g/dL (ref 31.5–35.7)
MCV: 85 fL (ref 79–97)
MONOCYTES: 5 %
NEUTROPHILS: 65 %
PLATELET: 314 10*3/uL (ref 150–450)
RBC: 4.7 x10E6/uL (ref 3.77–5.28)
RDW: 14.3 % (ref 11.7–15.4)
WBC: 8.9 10*3/uL (ref 3.4–10.8)

## 2020-01-12 LAB — IRON PROFILE
Iron % saturation: 14 % — ABNORMAL LOW (ref 15–55)
Iron: 51 ug/dL (ref 27–159)
TIBC: 361 ug/dL (ref 250–450)
UIBC: 310 ug/dL (ref 131–425)

## 2020-01-12 LAB — METABOLIC PANEL, COMPREHENSIVE
A-G Ratio: 1.7 (ref 1.2–2.2)
ALT (SGPT): 13 IU/L (ref 0–32)
AST (SGOT): 12 IU/L (ref 0–40)
Albumin: 4.3 g/dL (ref 3.8–4.8)
Alk. phosphatase: 97 IU/L (ref 39–117)
BUN/Creatinine ratio: 21 (ref 9–23)
BUN: 17 mg/dL (ref 6–24)
Bilirubin, total: 0.2 mg/dL (ref 0.0–1.2)
CO2: 28 mmol/L (ref 20–29)
Calcium: 8.9 mg/dL (ref 8.7–10.2)
Chloride: 103 mmol/L (ref 96–106)
Creatinine: 0.81 mg/dL (ref 0.57–1.00)
GFR est AA: 99 mL/min/{1.73_m2} (ref >59)
GFR est non-AA: 86 mL/min/{1.73_m2} (ref >59)
GLOBULIN, TOTAL: 2.6 g/dL (ref 1.5–4.5)
Glucose: 107 mg/dL — ABNORMAL HIGH (ref 65–99)
Potassium: 4.5 mmol/L (ref 3.5–5.2)
Protein, total: 6.9 g/dL (ref 6.0–8.5)
Sodium: 143 mmol/L (ref 134–144)

## 2020-01-12 LAB — VITAMIN D, 25 HYDROXY: VITAMIN D, 25-HYDROXY: 29.6 ng/mL — ABNORMAL LOW (ref 30.0–100.0)

## 2020-01-12 LAB — HEMOGLOBIN A1C WITH EAG
Estimated average glucose: 123 mg/dL
Hemoglobin A1c: 5.9 % — ABNORMAL HIGH (ref 4.8–5.6)

## 2020-01-18 ENCOUNTER — Ambulatory Visit: Attending: Nurse Practitioner | Primary: Nurse Practitioner

## 2020-01-18 ENCOUNTER — Ambulatory Visit
Admit: 2020-01-18 | Discharge: 2020-01-18 | Payer: PRIVATE HEALTH INSURANCE | Attending: Nurse Practitioner | Primary: Nurse Practitioner

## 2020-01-18 DIAGNOSIS — J449 Chronic obstructive pulmonary disease, unspecified: Secondary | ICD-10-CM

## 2020-01-18 MED ORDER — GABAPENTIN 100 MG CAP
100 mg | ORAL_CAPSULE | Freq: Every evening | ORAL | 0 refills | Status: DC | PRN
Start: 2020-01-18 — End: 2020-02-01

## 2020-01-18 MED ORDER — PREDNISONE 10 MG TAB
10 mg | ORAL_TABLET | ORAL | 0 refills | Status: AC
Start: 2020-01-18 — End: ?

## 2020-01-18 MED ORDER — TIOTROPIUM BROMIDE 18 MCG CAPS WITH INHALATION DEVICE
18 mcg | ORAL_CAPSULE | Freq: Every day | RESPIRATORY_TRACT | 2 refills | Status: AC
Start: 2020-01-18 — End: ?

## 2020-01-18 MED ORDER — ALBUTEROL SULFATE HFA 90 MCG/ACTUATION AEROSOL INHALER
90 mcg/actuation | RESPIRATORY_TRACT | 2 refills | Status: AC | PRN
Start: 2020-01-18 — End: ?

## 2020-01-18 MED ORDER — METFORMIN 500 MG TAB
500 mg | ORAL_TABLET | Freq: Every day | ORAL | 2 refills | Status: AC
Start: 2020-01-18 — End: ?

## 2020-01-18 NOTE — Patient Instructions (Signed)
COPD Exacerbation Plan: Care Instructions  Your Care Instructions     If you have chronic obstructive pulmonary disease (COPD), your usual shortness of breath could suddenly get worse. You may start coughing more and have more mucus. This flare-up is called a COPD exacerbation (say "ig-ZAS-ur-BAY-shun").  A lung infection or air pollution could set off an exacerbation. Sometimes it can happen after a quick change in temperature or being around chemicals.  Work with your doctor to make a plan for dealing with an exacerbation. You can better manage it if you plan ahead.  Follow-up care is a key part of your treatment and safety. Be sure to make and go to all appointments, and call your doctor if you are having problems. It's also a good idea to know your test results and keep a list of the medicines you take.  How can you care for yourself at home?  During an exacerbation  ?? Do not panic if you start to have one. Quick treatment at home may help you prevent serious breathing problems. If you have a COPD exacerbation plan that you developed with your doctor, follow it.  ?? Take your medicines exactly as your doctor tells you.  ? Use your inhaler as directed by your doctor. If your symptoms do not get better after you use your medicine, have someone take you to the emergency room. Call an ambulance if necessary.  ? With inhaled medicines, a spacer or a nebulizer may help you get more medicine to your lungs. Ask your doctor or pharmacist how to use them properly. Practice using the spacer in front of a mirror before you have an exacerbation. This may help you get the medicine into your lungs quickly.  ? If your doctor has given you steroid pills, take them as directed.  ? Your doctor may have given you a prescription for antibiotics, which you can fill if you need it.  ? Talk to your doctor if you have any problems with your medicine. And call your doctor if you have to use your antibiotic or steroid pills.   Preventing an exacerbation  ?? Do not smoke. This is the most important step you can take to prevent more damage to your lungs and prevent problems. If you already smoke, it is never too late to stop. If you need help quitting, talk to your doctor about stop-smoking programs and medicines. These can increase your chances of quitting for good.  ?? Take your daily medicines as prescribed.  ?? Avoid colds and flu.  ? Get a pneumococcal vaccine.  ? Get a flu vaccine each year, as soon as it is available. Ask those you live or work with to do the same, so they will not get the flu and infect you.  ? Try to stay away from people with colds or the flu.  ? Wash your hands often.  ?? Avoid secondhand smoke; air pollution; cold, dry air; hot, humid air; and high altitudes. Stay at home with your windows closed when air pollution is bad.  ?? Learn breathing techniques for COPD, such as breathing through pursed lips. These techniques can help you breathe easier during an exacerbation.  When should you call for help?   Call 911 anytime you think you may need emergency care. For example, call if:  ?? ?? You have severe trouble breathing.   ?? ?? You have severe chest pain.   Call your doctor now or seek immediate medical care if:  ?? ??  You have new or worse shortness of breath.   ?? ?? You develop new chest pain.   ?? ?? You are coughing more deeply or more often, especially if you notice more mucus or a change in the color of your mucus.   ?? ?? You cough up blood.   ?? ?? You have new or increased swelling in your legs or belly.   ?? ?? You have a fever.   Watch closely for changes in your health, and be sure to contact your doctor if:  ?? ?? You need to use your antibiotic or steroid pills.   ?? ?? Your symptoms are getting worse.   Where can you learn more?  Go to [email or web address]  Enter U536 in the search box to learn more about "COPD Exacerbation Plan: Care Instructions."  Current as of: July 10, 2019??????????????????????????????Content Version: 12.8  ?? 2006-2021 [employer].   Care instructions adapted under license by [employer] (which disclaims liability or warranty for this information). If you have questions about a medical condition or this instruction, always ask your healthcare professional. [employer] disclaims any warranty or liability for your use of this information.         Chronic Obstructive Pulmonary Disease (COPD): Care Instructions  Your Care Instructions     Chronic obstructive pulmonary disease (COPD) is a general term for a group of lung diseases, including emphysema and chronic bronchitis. People with COPD have decreased airflow in and out of the lungs, which makes it hard to breathe. The airways also can get clogged with thick mucus. Cigarette smoking is a major cause of COPD.  Although there is no cure for COPD, you can slow its progress. Following your treatment plan and taking care of yourself can help you feel better and live longer.  Follow-up care is a key part of your treatment and safety. Be sure to make and go to all appointments, and call your doctor if you are having problems. It's also a good idea to know your test results and keep a list of the medicines you take.  How can you care for yourself at home?  Staying healthy  ?? ?? Do not smoke. This is the most important step you can take to prevent more damage to your lungs. If you need help quitting, talk to your doctor about stop-smoking programs and medicines. These can increase your chances of quitting for good.   ?? ?? Avoid colds and flu. Get a pneumococcal vaccine shot. If you have had one before, ask your doctor whether you need a second dose. Get the flu vaccine every fall. If you must be around people with colds or the flu, wash your hands often.   ?? ?? Avoid secondhand smoke, air pollution, and high altitudes. Also avoid cold, dry air and hot, humid air. Stay at home with your windows closed when  air pollution is bad.   Medicines and oxygen therapy  ?? ?? Take your medicines exactly as prescribed. Call your doctor if you think you are having a problem with your medicine. You may be taking medicines such as:  ? Bronchodilators. These help open your airways and make breathing easier. They are either short-acting (work for 6 to 9 hours) or long-acting (work for 24 hours). You inhale most bronchodilators, so they start to act quickly. Always carry your quick-relief inhaler with you in case you need it while you are away from home.  ? Corticosteroids (prednisone, budesonide). These reduce airway  inflammation. They come in pill or inhaled form. You must take these medicines every day for them to work well.   ?? ?? Ask your doctor or pharmacist if a spacer is right for you. A spacer may help you get more inhaled medicine to your lungs. If you use one, ask how to use it properly.   ?? ?? Do not take any vitamins, over-the-counter medicine, or herbal products without talking to your doctor first.   ?? ?? If your doctor prescribed antibiotics, take them as directed. Do not stop taking them just because you feel better. You need to take the full course of antibiotics.   ?? ?? If you use oxygen therapy, use the flow rate your doctor has recommended. Don't change it without talking to your doctor first. Oxygen therapy boosts the amount of oxygen in your blood and helps you breathe easier.   Activity  ?? ?? Get regular exercise. Walking is an easy way to get exercise. Start out slowly, and walk a little more each day.   ?? ?? Pay attention to your breathing. You are exercising too hard if you can't talk while you exercise.   ?? ?? Take short rest breaks when doing household chores and other activities.   ?? ?? Learn breathing methods???such as breathing through pursed lips???to help you become less short of breath.   ?? ?? If your doctor has not set you up with a pulmonary rehabilitation program, ask if rehab is right for you. Rehab includes  exercise programs, education about your disease and how to manage it, help with diet and other changes, and emotional support.   Diet  ?? ?? Eat regular, healthy meals. Use bronchodilators about 1 hour before you eat to make it easier to eat. Eat several small meals instead of three large ones. Drink beverages at the end of the meal. Avoid foods that are hard to chew.   ?? ?? Eat foods that contain protein so you don't lose muscle mass.   ?? ?? Talk with your doctor if you gain too much weight or if you lose weight without trying.   Mental health  ?? ?? Talk to your family, friends, or a therapist about your feelings. Some people feel frightened, angry, hopeless, helpless, and even guilty. Talking openly about bad feelings can help you cope. If these feelings last, talk to your doctor.   When should you call for help?   Call 911 anytime you think you may need emergency care. For example, call if:  ?? ?? You have severe trouble breathing.   Call your doctor now or seek immediate medical care if:  ?? ?? You have new or worse trouble breathing.   ?? ?? You cough up blood.   ?? ?? You have a fever.   Watch closely for changes in your health, and be sure to contact your doctor if:  ?? ?? You cough more deeply or more often, especially if you notice more mucus or a change in the color of your mucus.   ?? ?? You have new or worse swelling in your legs or belly.   ?? ?? You are not getting better as expected.   Where can you learn more?  Go to [email or web address]  Enter [phone] in the search box to learn more about "Chronic Obstructive Pulmonary Disease (COPD): Care Instructions."  Current as of: July 10, 2019??????????????????????????????Content Version: 12.8  ?? 2006-2021 [employer].   Care instructions adapted under license by [employer] (which disclaims liability or warranty for this information). If you have questions about a medical condition or this instruction, always ask your healthcare professional.  [employer] disclaims any warranty or liability for your use of this information.         Chronic Obstructive Pulmonary Disease (COPD) Flare-Ups: Care Instructions  Your Care Instructions     Chronic obstructive pulmonary disease (COPD) is a lung disease that makes it hard to breathe. It is caused by damage to the lungs over many years, usually from smoking.  COPD is often a mix of two diseases:  ?? Chronic bronchitis: The airways that carry air to the lungs (bronchial tubes) get inflamed and make a lot of mucus. This can narrow or block the airways.  ?? Emphysema: In a healthy person, the tiny air sacs in the lungs are like balloons. As you breathe in and out, they get bigger and smaller to move air through your lungs. But with emphysema, these air sacs are damaged and lose their stretch. Less air gets in and out of the lungs.  Many people with COPD have attacks called flare-ups or exacerbations. This is when your usual symptoms quickly get worse and stay worse.  The doctor has checked you carefully. But problems can develop later. If you notice any problems or new symptoms, get medical treatment right away.  Follow-up care is a key part of your treatment and safety. Be sure to make and go to all appointments, and call your doctor if you are having problems. It's also a good idea to know your test results and keep a list of the medicines you take.  How can you care for yourself at home?  ?? Be safe with medicines. Take your medicines exactly as prescribed. Call your doctor if you think you are having a problem with your medicine. You may be taking medicines such as:  ? Bronchodilators. These help open your airways and make breathing easier.  ? Corticosteroids. These reduce airway inflammation. They may be given as pills, in a vein, or in an inhaled form. You may go home with pills in addition to an inhaler that you already use.  ?? A spacer may help you get more inhaled medicine to your lungs. Ask your  doctor or pharmacist if a spacer is right for you. If it is, ask how to use it properly.  ?? If your doctor prescribed antibiotics, take them as directed. Do not stop taking them just because you feel better. You need to take the full course of antibiotics.  ?? If your doctor prescribed oxygen, use the flow rate your doctor has recommended. Do not change it without talking to your doctor first.  ?? Do not smoke. Smoking makes COPD worse. If you need help quitting, talk to your doctor about stop-smoking programs and medicines. These can increase your chances of quitting for good.  When should you call for help?   Call 911 anytime you think you may need emergency care. For example, call if:  ?? ?? You have severe trouble breathing.   Call your doctor now or seek immediate medical care if:  ?? ?? You have new or worse trouble breathing.   ?? ?? Your coughing or wheezing gets worse.   ?? ?? You cough up dark brown or bloody mucus (sputum).   ?? ?? You have a new or higher fever.   Watch closely for changes in your health, and be sure to contact your doctor if:  ?? ??  You notice more mucus or a change in the color of your mucus.   ?? ?? You need to use your antibiotic or steroid pills.   ?? ?? You do not get better as expected.   Where can you learn more?  Go to [email or web address]  Enter D989 in the search box to learn more about "Chronic Obstructive Pulmonary Disease (COPD) Flare-Ups: Care Instructions."  Current as of: July 10, 2019??????????????????????????????Content Version: 12.8  ?? 2006-2021 [employer].   Care instructions adapted under license by [employer] (which disclaims liability or warranty for this information). If you have questions about a medical condition or this instruction, always ask your healthcare professional. [employer] disclaims any warranty or liability for your use of this information.    Tiotropium (By breathing)   Tiotropium (tye-oh-TROE-pee-um)  Treats  asthma and chronic obstructive pulmonary disease (COPD).   Brand Name(s): Spiriva, Spiriva Respimat   There may be other brand names for this medicine.  When This Medicine Should Not Be Used:   This medicine is not right for everyone. Do not use it if you had an allergic reaction to tiotropium, ipratropium, or atropine.  How to Use This Medicine:   Capsule, Spray  ?? Your doctor will tell you how much medicine to use. Do not use more than directed. Use this medicine at the same time each day.  ?? Read and follow the patient instructions that come with this medicine. Talk to your doctor or pharmacist if you have any questions.  ?? Spiriva?? HandiHaler??:   ?? Spiriva?? capsules should be used only with the HandiHaler?? device. Do not swallow the capsule. Do not use the device with any other medicine.  ?? Do not remove the capsule from the blister pack until you are ready to use it. Use the capsule right away once you have opened a blister pack.  ?? Do not let the powder from the capsule get into your eyes.  ?? After you have used a dose, remove the used capsule and throw it away.  ?? Spiriva?? Respimat?? inhaler:   ?? Do not use the inhaler with any other medicine.  ?? Do not turn the clear base before you insert the cartridge.  ?? Do not remove the cartridge once it has been inserted in the inhaler.  ?? When you use the inhaler for the first time, you will need to prime it to make sure it delivers the correct amount of medicine. To prime the inhaler, point it away from your face and press the dose release button until you see a spray of medicine. Then press the button 3 more times. The inhaler is now ready to use.  ?? If you have not used your inhaler for more than 3 days, spray 1 dose of medicine away from your face to prime the inhaler. If you have not used your inhaler for more than 21 days, repeat the instructions for priming the inhaler for the first time.  ?? Missed dose: Take a dose as soon as you remember. If it is almost time  for your next dose, wait until then and take a regular dose. Do not take extra medicine to make up for a missed dose. Do not use this medicine more than once every 24 hours.  ?? Store the medicine in a closed container at room temperature, away from heat, moisture, and direct light.   ?? Spiriva?? HandiHaler??: Leave the capsules in the blister pack until you are ready to use  the medicine.  ?? Spiriva?? Respimat?? inhaler: Throw away the inhaler 3 months after its first use.  Drugs and Foods to Avoid:   Ask your doctor or pharmacist before using any other medicine, including over-the-counter medicines, vitamins, and herbal products.  ?? Some medicines can affect how tiotropium works. Tell your doctor if you are using ipratropium or other inhaled medicines.  Warnings While Using This Medicine:   ?? Tell your doctor if you are pregnant or breastfeeding, or if you have kidney disease, glaucoma, prostate problems, or problems urinating. Tell your doctor if you are allergic to milk proteins.  ?? This medicine may cause the following problems:  ?? Paradoxical bronchospasm (increased trouble breathing), which can be life-threatening  ?? New or worsening narrow-angle glaucoma  ?? New or worsening trouble urinating  ?? This medicine may cause dizziness or blurred vision. Do not drive or do anything else that could be dangerous until you know how this medicine affects you.  ?? Tell your doctor right away if you use other medicines for your lung condition and they do not seem to be working as well as usual. Do not change your doses or stop using your medicines before you talk to your doctor.  ?? Your doctor will check your progress and the effects of this medicine at regular visits. Keep all appointments.  ?? Keep all medicine out of the reach of children. Never share your medicine with anyone.  Possible Side Effects While Using This Medicine:   Call your doctor right away if you notice any of these side effects:  ?? Allergic reaction: Itching  or hives, swelling in your face or hands, swelling or tingling in your mouth or throat, chest tightness, trouble breathing  ?? Change in how much or how often you urinate, painful or difficult urination  ?? Eye pain or redness, vision problems, seeing halos around lights  ?? Increased trouble breathing  If you notice these less serious side effects, talk with your doctor:   ?? Dry mouth  ?? Cough, fever, runny nose, or sore throat  If you notice other side effects that you think are caused by this medicine, tell your doctor.   Call your doctor for medical advice about side effects. You may report side effects to FDA at 1-800-FDA-1088  ?? 2017 [facility] Information is for End User's use only and may not be sold, redistributed or otherwise used for commercial purposes.  The above information is an educational aid only. It is not intended as medical advice for individual conditions or treatments. Talk to your doctor, nurse or pharmacist before following any medical regimen to see if it is safe and effective for you.       Prediabetes: Care Instructions  Overview     Prediabetes is a warning sign that you're at risk for getting type 2 diabetes. It means that your blood sugar is higher than it should be. But it's not high enough to be diabetes.  The food you eat naturally turns into sugar. Your body uses the sugar for energy. Normally, an organ called the pancreas makes insulin. And insulin allows the sugar in your blood to get into your body's cells. But sometimes the body can't use insulin the right way. So the sugar stays in your blood instead. This is called insulin resistance. The buildup of sugar in your blood means you have prediabetes.  The good news is that you may be able to prevent or delay diabetes. Making small  lifestyle changes, like getting active and changing your eating habits, may help you get your blood sugar back to normal. You can work with your doctor to make a treatment plan.  Follow-up  care is a key part of your treatment and safety. Be sure to make and go to all appointments, and call your doctor if you are having problems. It's also a good idea to know your test results and keep a list of the medicines you take.  How can you care for yourself at home?  ?? Watch your weight. A healthy weight helps your body use insulin properly.  ?? Limit the amount of calories, sweets, and unhealthy fat you eat. Ask your doctor if you should see a dietitian. A registered dietitian can help you create meal plans that fit your lifestyle.  ?? Get at least 30 minutes of exercise on most days of the week. Exercise helps control your blood sugar. It also helps you maintain a healthy weight. Walking is a good choice. You also may want to do other activities, such as running, swimming, cycling, or playing tennis or team sports.  ?? Do not smoke. Smoking can make prediabetes worse. If you need help quitting, talk to your doctor about stop-smoking programs and medicines. These can increase your chances of quitting for good.  ?? If your doctor prescribed medicines, take them exactly as prescribed. Call your doctor if you think you are having a problem with your medicine. You will get more details on the specific medicines your doctor prescribes.  When should you call for help?  Watch closely for changes in your health, and be sure to contact your doctor if:  ?? ?? You have any symptoms of diabetes. These may include:  ? Being thirsty more often.  ? Urinating more.  ? Being hungrier.  ? Losing weight.  ? Being very tired.  ? Having blurry vision.   ?? ?? You have a wound that will not heal.   ?? ?? You have an infection that will not go away.   ?? ?? You have problems with your blood pressure.   ?? ?? You want more information about diabetes and how you can keep from getting it.   Where can you learn more?  Go to [email or web address]  Enter I222 in the search box to learn more about "Prediabetes: Care  Instructions."  Current as of: May 15, 2019??????????????????????????????Content Version: 12.8  ?? 2006-2021 [employer].   Care instructions adapted under license by [employer] (which disclaims liability or warranty for this information). If you have questions about a medical condition or this instruction, always ask your healthcare professional. [employer] disclaims any warranty or liability for your use of this information.         Carpal Tunnel Syndrome: Care Instructions  Overview     Carpal tunnel syndrome is numbness, tingling, weakness, and pain in your hand, wrist, and sometimes forearm. It is caused by pressure on the median nerve. This nerve and several tough tissues called tendons run through a space in the wrist. This space is called the carpal tunnel.  The repeated hand motions used in work and some hobbies and sports can put pressure on the median nerve. Pregnancy can cause carpal tunnel syndrome. Several conditions, such as diabetes, arthritis, and an underactive thyroid, can also cause it.  You may be able to limit an activity or change the way you do it to reduce your symptoms. You also can take other steps  to feel better. If your symptoms are mild, 1 to 2 weeks of home treatment are likely to ease your pain. Surgery is needed only if other treatments do not work.  Follow-up care is a key part of your treatment and safety. Be sure to make and go to all appointments, and call your doctor if you are having problems. It's also a good idea to know your test results and keep a list of the medicines you take.  How can you care for yourself at home?  ?? If possible, stop or reduce the activity that causes your symptoms. If you cannot stop the activity, take frequent breaks to rest and stretch or change hand positions to do a task. Try switching hands, such as when using a computer mouse.  ?? Try to avoid bending or twisting your wrists.  ?? Ask your doctor if you can take an  over-the-counter pain medicine, such as acetaminophen (Tylenol), ibuprofen (Advil, Motrin), or naproxen (Aleve). Be safe with medicines. Read and follow all instructions on the label.  ?? If your doctor prescribes corticosteroid medicine to help reduce pain and swelling, take it exactly as prescribed. Call your doctor if you think you are having a problem with your medicine.  ?? Put ice or a cold pack on your wrist for 10 to 20 minutes at a time to ease pain. Put a thin cloth between the ice and your skin.  ?? If your doctor or your physical or occupational therapist tells you to wear a wrist splint, wear it as directed to keep your wrist in a neutral position. This also eases pressure on your median nerve.  ?? Ask your doctor whether you should have physical or occupational therapy to learn how to do tasks differently.  ?? Try a yoga class to stretch your muscles and build strength in your hands and wrists. Yoga has been shown to ease carpal tunnel symptoms.  To prevent carpal tunnel  ?? When working at a computer, keep your hands and wrists in line with your forearms. Hold your elbows close to your sides. Take a break every 10 to 15 minutes.  ?? Try these exercises:  ? Warm up: Rotate your wrist up, down, and from side to side. Repeat this 4 times. Stretch your fingers far apart, relax them, then stretch them again. Repeat 4 times. Stretch your thumb by pulling it back gently, holding it, and then releasing it. Repeat 4 times.  ? Prayer stretch: Start with your palms together in front of your chest just below your chin. Slowly lower your hands toward your waistline while keeping your hands close to your stomach and your palms together until you feel a mild to moderate stretch under your forearms. Hold for 10 to 20 seconds. Repeat 4 times.  ? Wrist flexor stretch: Hold your arm in front of you with your palm up. Bend your wrist, pointing your hand toward the floor. With your other hand, gently bend your wrist further  until you feel a mild to moderate stretch in your forearm. Hold for 10 to 20 seconds. Repeat 4 times.  ? Wrist extensor stretch: Repeat the steps for the wrist flexor stretch, but begin with your extended hand palm down.  ?? Squeeze a rubber exercise ball several times a day to keep your hands and fingers strong.  ?? Avoid holding objects (such as a book) in one position for a long time. When possible, use your whole hand to grasp an object. Using just the  thumb and index finger can put stress on the wrist.  ?? Do not smoke. It can make this condition worse by reducing blood flow to the median nerve. If you need help quitting, talk to your doctor about stop-smoking programs and medicines. These can increase your chances of quitting for good.  When should you call for help?  Watch closely for changes in your health, and be sure to contact your doctor if:  ?? ?? Your pain or other problems do not get better with home care.   ?? ?? You want more information about physical or occupational therapy.   ?? ?? You have side effects of your corticosteroid medicine, such as:  ? Weight gain.  ? Mood changes.  ? Trouble sleeping.  ? Bruising easily.   ?? ?? You have any other problems with your medicine.   Where can you learn more?  Go to [email or web address]  Enter R432 in the search box to learn more about "Carpal Tunnel Syndrome: Care Instructions."  Current as of: July 31, 2019??????????????????????????????Content Version: 12.8  ?? 2006-2021 [employer].   Care instructions adapted under license by [employer] (which disclaims liability or warranty for this information). If you have questions about a medical condition or this instruction, always ask your healthcare professional. [employer] disclaims any warranty or liability for your use of this information.  Call ortho Dr. Marquita Palms and make apt  Prednisone taper as directed  Metformin 2 tabs at dinner  spiriva daily  Gapapentin trial   Follow up 2 weeks.

## 2020-01-18 NOTE — Progress Notes (Signed)
Chief Complaint   Patient presents with   ??? Labs     review lab results
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
## 2020-01-18 NOTE — Progress Notes (Signed)
Chief Complaint   Patient presents with   . Labs     review lab results

## 2020-01-27 DIAGNOSIS — J449 Chronic obstructive pulmonary disease, unspecified: Secondary | ICD-10-CM

## 2020-02-01 ENCOUNTER — Ambulatory Visit: Attending: Nurse Practitioner | Primary: Nurse Practitioner

## 2020-02-01 ENCOUNTER — Ambulatory Visit
Admit: 2020-02-01 | Discharge: 2020-02-01 | Payer: PRIVATE HEALTH INSURANCE | Attending: Nurse Practitioner | Primary: Nurse Practitioner

## 2020-02-01 DIAGNOSIS — J449 Chronic obstructive pulmonary disease, unspecified: Secondary | ICD-10-CM

## 2020-02-01 MED ORDER — GABAPENTIN 100 MG CAP
100 mg | ORAL_CAPSULE | Freq: Every evening | ORAL | 0 refills | Status: AC | PRN
Start: 2020-02-01 — End: 2020-03-02

## 2020-02-01 NOTE — Progress Notes (Signed)
Chief Complaint   Patient presents with   ??? Wheezing     follow up COPD     No flowsheet data found.    3 most recent PHQ Screens 02/01/2020   Little interest or pleasure in doing things Not at all   Feeling down, depressed, irritable, or hopeless Not at all   Total Score PHQ 2 0       Abuse Screening Questionnaire 02/01/2020   Do you ever feel afraid of your partner? N   Are you in a relationship with someone who physically or mentally threatens you? N   Is it safe for you to go home? Y       ADL Assessment 02/01/2020   Feeding yourself No Help Needed   Getting from bed to chair No Help Needed   Getting dressed No Help Needed   Bathing or showering No Help Needed   Walk across the room (includes cane/walker) No Help Needed   Using the telphone No Help Needed   Taking your medications No Help Needed   Preparing meals No Help Needed   Managing money (expenses/bills) No Help Needed   Moderately strenuous housework (laundry) No Help Needed   Shopping for personal items (toiletries/medicines) No Help Needed   Shopping for groceries No Help Needed   Driving No Help Needed   Climbing a flight of stairs No Help Needed   Getting to places beyond walking distances No Help Needed

## 2020-02-01 NOTE — Progress Notes (Signed)
Subjective: (As above and below)     Chief Complaint   Patient presents with   ??? Wheezing     follow up COPD     HPI  She is a 49 y.o. year old female who presents for evaluation. Was previously followed by [facility]. Moved from [place], [place]. Do not have old records yet.   Went to cardiology Dr. Francesco Runner. Hx 4 heart attacks. Hereditary- strong cardiac family history. Pt reported that the cardiologist stated that "everything looked good and follow up 6 months."     During previous visit 01/18/20 gave a steroid script for 5 days which helped wheezing. Finished course of Prednisone.  Started her on Tiotropium (Spiriva) inhaler  last visit. Stated it is "working well."    Proventil albuterol inhaler 2 puffs q 4hr PRN. Uses once daily.  Smokes 10-15 cigs day x 30 years. Has COPD. Cough occas green sputum.   Tried Nicoderm patches. Discussed smoking cessation    Called ortho Dr. Pam Drown for carpal tunnel. They do not accept her insurance. Discussed referral to Dr. Stacie Acres.   Stated Gapapentin helps.  Complains of neuropathy/ dull nerve pain in first 4 fingers right hand (except 5th digit). Tried wrist braces without success.      Most recent A1C was 5.9. Stated Metformin causing a lot of GI upset and nausea. Suggested that she just try 500mg  at dinner.   Got Vitamin D and Calcium and taking daily.     PMP 000    Reviewed PmHx, RxHx, FmHx, SocHx, AllgHx and updated in chart.  Patient Active Problem List   Diagnosis Code   ??? Chronic obstructive pulmonary disease, unspecified COPD type ([place]) J44.9   ??? Prediabetes R73.03   ??? Carpal tunnel syndrome of right wrist G56.01     Review of Systems:  Gen: no fatigue, fever, chills  Eyes: no excessive tearing, itching, or discharge  Nose: no rhinorrhea, no sinus pain  Mouth: no oral lesions, no sore throat  Resp: no shortness of breath, no wheezing, + cough  CV: no chest pain, no paroxysmal nocturnal dyspnea  Abd: no nausea, no heartburn, no diarrhea, no constipation, no abdominal pain   Neuro: no headaches, no syncope or presyncopal episodes  Endo: no polyuria, no polydipsia  Heme: no lymphadenopathy, no easy bruising or bleeding    No Known Allergies  Current Outpatient Medications   Medication Sig   ??? metFORMIN (GLUCOPHAGE) 500 mg tablet Take 2 Tabs by mouth daily (with dinner). Indications: type 2 diabetes mellitus   ??? albuterol (PROVENTIL HFA, VENTOLIN HFA, PROAIR HFA) 90 mcg/actuation inhaler Take 2 Puffs by inhalation every four (4) hours as needed for Wheezing. Indications: chronic obstructive pulmonary disease   ??? tiotropium (SPIRIVA) 18 mcg inhalation capsule Take 1 Cap by inhalation daily. Indications: bronchospasm prevention with COPD   ??? gabapentin (NEURONTIN) 100 mg capsule Take 1 Cap by mouth nightly as needed for Pain for up to 14 days. Max Daily Amount: 100 mg. Indications: neuropathic pain   ??? amitriptyline (ELAVIL) 50 mg tablet Take 2 Tabs by mouth nightly.   ??? amLODIPine (NORVASC) 10 mg tablet Take 1 Tab by mouth daily.   ??? celecoxib (CeleBREX) 200 mg capsule Take 1 Cap by mouth daily.   ??? citalopram (CELEXA) 40 mg tablet Take 1 Tab by mouth daily.   ??? isosorbide mononitrate ER (IMDUR) 60 mg CR tablet Take 1 Tab by mouth two (2) times a day.   ??? nitroglycerin (NITROSTAT) 0.4 mg SL  tablet 1 Tab by SubLINGual route every five (5) minutes as needed for Chest Pain. Up to 3 doses.   ??? rosuvastatin (CRESTOR) 20 mg tablet Take 1 Tab by mouth nightly. Indications: excessive fat in the blood   ??? predniSONE (DELTASONE) 10 mg tablet 4 tabs for 2 day, 3 tabs for 2 days, 2 tabs for 2 days, 1tab for 2 days  Indications: worsening chronic obstructive pulmonary disease (Patient not taking: Reported on 02/01/2020)     No current facility-administered medications for this visit.     Objective:     Visit Vitals  BP 109/74 (BP 1 Location: Left upper arm, BP Patient Position: Sitting, BP Cuff Size: Large adult)   Pulse 84   Temp 97.7 ??F (36.5 ??C) (Oral)   Resp 20   Ht 5' 5.75" (1.67 m)   Wt 213 lb  (96.6 kg)   SpO2 93%   BMI 34.64 kg/m??      Physical Examination:   Gen: alert, oriented, no acute distress  Head: normocephalic, atraumatic  Resp: no increased work of breathing, lungs exp wheezes and scattered rhonchii- to ausculation bilaterally  CV: S1, S2 normal,+ murmur, rubs, or gallops.    Abd: soft, not tender, not distended.  No hepatosplenomegaly. Normal bowel sounds.  No hernias.   Skin: no lesion or rash  Musc: POSITIVE PHALEN TEST. Discomfort with flexion and extension of right wrist.     Assessment/ Plan:     1. Right wrist pain  - REFERRAL TO ORTHOPEDICS    2. Chronic obstructive pulmonary disease, unspecified COPD type (HCC)  - XR CHEST PA LAT; Future    3. Carpal tunnel syndrome of right wrist  - gabapentin (NEURONTIN) 100 mg capsule; Take 1 Capsule by mouth nightly as needed for Pain for up to 30 days. Max Daily Amount: 100 mg. Indications: neuropathic pain  Dispense: 30 Capsule; Refill: 0    4. Prediabetes    Call ortho Dr.Wind and make apt.  CXR pending.   Renewed Gabapentin.  Follow up 1 week.    I have discussed the diagnosis with the patient and the intended plan as seen in the above orders.  The patient has received an after-visit summary and questions were answered concerning future plans. If symptoms worsen, go to the ER.    Medication Side Effects and Warnings were discussed with patient: yes  Patient Labs were reviewed: yes  Patient Past Records were reviewed: Cardiology note and previous visits.     Standley Dakins, NP

## 2020-02-01 NOTE — Patient Instructions (Signed)
Chronic Obstructive Pulmonary Disease (COPD): Care Instructions  Your Care Instructions     Chronic obstructive pulmonary disease (COPD) is a general term for a group of lung diseases, including emphysema and chronic bronchitis. People with COPD have decreased airflow in and out of the lungs, which makes it hard to breathe. The airways also can get clogged with thick mucus. Cigarette smoking is a major cause of COPD.  Although there is no cure for COPD, you can slow its progress. Following your treatment plan and taking care of yourself can help you feel better and live longer.  Follow-up care is a key part of your treatment and safety. Be sure to make and go to all appointments, and call your doctor if you are having problems. It's also a good idea to know your test results and keep a list of the medicines you take.  How can you care for yourself at home?  Staying healthy  ?? ?? Do not smoke. This is the most important step you can take to prevent more damage to your lungs. If you need help quitting, talk to your doctor about stop-smoking programs and medicines. These can increase your chances of quitting for good.   ?? ?? Avoid colds and flu. Get a pneumococcal vaccine shot. If you have had one before, ask your doctor whether you need a second dose. Get the flu vaccine every fall. If you must be around people with colds or the flu, wash your hands often.   ?? ?? Avoid secondhand smoke, air pollution, and high altitudes. Also avoid cold, dry air and hot, humid air. Stay at home with your windows closed when air pollution is bad.   Medicines and oxygen therapy  ?? ?? Take your medicines exactly as prescribed. Call your doctor if you think you are having a problem with your medicine. You may be taking medicines such as:  ? Bronchodilators. These help open your airways and make breathing easier. They are either short-acting (work for 6 to 9 hours) or long-acting (work for 24 hours). You inhale most bronchodilators, so  they start to act quickly. Always carry your quick-relief inhaler with you in case you need it while you are away from home.  ? Corticosteroids (prednisone, budesonide). These reduce airway inflammation. They come in pill or inhaled form. You must take these medicines every day for them to work well.   ?? ?? Ask your doctor or pharmacist if a spacer is right for you. A spacer may help you get more inhaled medicine to your lungs. If you use one, ask how to use it properly.   ?? ?? Do not take any vitamins, over-the-counter medicine, or herbal products without talking to your doctor first.   ?? ?? If your doctor prescribed antibiotics, take them as directed. Do not stop taking them just because you feel better. You need to take the full course of antibiotics.   ?? ?? If you use oxygen therapy, use the flow rate your doctor has recommended. Don't change it without talking to your doctor first. Oxygen therapy boosts the amount of oxygen in your blood and helps you breathe easier.   Activity  ?? ?? Get regular exercise. Walking is an easy way to get exercise. Start out slowly, and walk a little more each day.   ?? ?? Pay attention to your breathing. You are exercising too hard if you can't talk while you exercise.   ?? ?? Take short rest breaks when doing household chores   and other activities.   ?? ?? Learn breathing methods???such as breathing through pursed lips???to help you become less short of breath.   ?? ?? If your doctor has not set you up with a pulmonary rehabilitation program, ask if rehab is right for you. Rehab includes exercise programs, education about your disease and how to manage it, help with diet and other changes, and emotional support.   Diet  ?? ?? Eat regular, healthy meals. Use bronchodilators about 1 hour before you eat to make it easier to eat. Eat several small meals instead of three large ones. Drink beverages at the end of the meal. Avoid foods that are hard to chew.   ?? ?? Eat foods that contain protein so you  don't lose muscle mass.   ?? ?? Talk with your doctor if you gain too much weight or if you lose weight without trying.   Mental health  ?? ?? Talk to your family, friends, or a therapist about your feelings. Some people feel frightened, angry, hopeless, helpless, and even guilty. Talking openly about bad feelings can help you cope. If these feelings last, talk to your doctor.   When should you call for help?   Call 911 anytime you think you may need emergency care. For example, call if:  ?? ?? You have severe trouble breathing.   Call your doctor now or seek immediate medical care if:  ?? ?? You have new or worse trouble breathing.   ?? ?? You cough up blood.   ?? ?? You have a fever.   Watch closely for changes in your health, and be sure to contact your doctor if:  ?? ?? You cough more deeply or more often, especially if you notice more mucus or a change in the color of your mucus.   ?? ?? You have new or worse swelling in your legs or belly.   ?? ?? You are not getting better as expected.   Where can you learn more?  Go to [email or web address]  Enter [phone] in the search box to learn more about "Chronic Obstructive Pulmonary Disease (COPD): Care Instructions."  Current as of: July 10, 2019??????????????????????????????Content Version: 12.8  ?? 2006-2021 [employer].   Care instructions adapted under license by [employer] (which disclaims liability or warranty for this information). If you have questions about a medical condition or this instruction, always ask your healthcare professional. [employer] disclaims any warranty or liability for your use of this information.         Chronic Obstructive Pulmonary Disease (COPD) Flare-Ups: Care Instructions  Your Care Instructions     Chronic obstructive pulmonary disease (COPD) is a lung disease that makes it hard to breathe. It is caused by damage to the lungs over many years, usually from smoking.  COPD is often a mix of two diseases:   ?? Chronic bronchitis: The airways that carry air to the lungs (bronchial tubes) get inflamed and make a lot of mucus. This can narrow or block the airways.  ?? Emphysema: In a healthy person, the tiny air sacs in the lungs are like balloons. As you breathe in and out, they get bigger and smaller to move air through your lungs. But with emphysema, these air sacs are damaged and lose their stretch. Less air gets in and out of the lungs.  Many people with COPD have attacks called flare-ups or exacerbations. This is when your usual symptoms quickly get worse and stay worse.  The doctor  has checked you carefully. But problems can develop later. If you notice any problems or new symptoms, get medical treatment right away.  Follow-up care is a key part of your treatment and safety. Be sure to make and go to all appointments, and call your doctor if you are having problems. It's also a good idea to know your test results and keep a list of the medicines you take.  How can you care for yourself at home?  ?? Be safe with medicines. Take your medicines exactly as prescribed. Call your doctor if you think you are having a problem with your medicine. You may be taking medicines such as:  ? Bronchodilators. These help open your airways and make breathing easier.  ? Corticosteroids. These reduce airway inflammation. They may be given as pills, in a vein, or in an inhaled form. You may go home with pills in addition to an inhaler that you already use.  ?? A spacer may help you get more inhaled medicine to your lungs. Ask your doctor or pharmacist if a spacer is right for you. If it is, ask how to use it properly.  ?? If your doctor prescribed antibiotics, take them as directed. Do not stop taking them just because you feel better. You need to take the full course of antibiotics.  ?? If your doctor prescribed oxygen, use the flow rate your doctor has recommended. Do not change it without talking to your doctor first.  ?? Do not smoke.  Smoking makes COPD worse. If you need help quitting, talk to your doctor about stop-smoking programs and medicines. These can increase your chances of quitting for good.  When should you call for help?   Call 911 anytime you think you may need emergency care. For example, call if:  ?? ?? You have severe trouble breathing.   Call your doctor now or seek immediate medical care if:  ?? ?? You have new or worse trouble breathing.   ?? ?? Your coughing or wheezing gets worse.   ?? ?? You cough up dark brown or bloody mucus (sputum).   ?? ?? You have a new or higher fever.   Watch closely for changes in your health, and be sure to contact your doctor if:  ?? ?? You notice more mucus or a change in the color of your mucus.   ?? ?? You need to use your antibiotic or steroid pills.   ?? ?? You do not get better as expected.   Where can you learn more?  Go to http://clayton-rivera.info/  Enter [id number] in the search box to learn more about "Chronic Obstructive Pulmonary Disease (COPD) Flare-Ups: Care Instructions."  Current as of: July 10, 2019??????????????????????????????Content Version: 12.8  ?? 2006-2021 [employer].   Care instructions adapted under license by [employer] (which disclaims liability or warranty for this information). If you have questions about a medical condition or this instruction, always ask your healthcare professional. [employer] any warranty or liability for your use of this information.         Stopping Smoking: Care Instructions  Your Care Instructions     Cigarette smokers crave the nicotine in cigarettes. Giving it up is much harder than simply changing a habit. Your body has to stop craving the nicotine. It is hard to quit, but you can do it. There are many tools that people use to quit smoking. You may find that combining tools works best for you.  There are several  steps to quitting. First you get ready to quit. Then you get support to help you. After that,  you learn new skills and behaviors to become a nonsmoker. For many people, a necessary step is getting and using medicine.  Your doctor will help you set up the plan that best meets your needs. You may want to attend a smoking cessation program to help you quit smoking. When you choose a program, look for one that has proven success. Ask your doctor for ideas. You will greatly increase your chances of success if you take medicine as well as get counseling or join a cessation program.  Some of the changes you feel when you first quit tobacco are uncomfortable. Your body will miss the nicotine at first, and you may feel short-tempered and grumpy. You may have trouble sleeping or concentrating. Medicine can help you deal with these symptoms. You may struggle with changing your smoking habits and rituals. The last step is the tricky one: Be prepared for the smoking urge to continue for a time. This is a lot to deal with, but keep at it. You will feel better.  Follow-up care is a key part of your treatment and safety. Be sure to make and go to all appointments, and call your doctor if you are having problems. It's also a good idea to know your test results and keep a list of the medicines you take.  How can you care for yourself at home?  ?? Ask your family, friends, and coworkers for support. You have a better chance of quitting if you have help and support.  ?? Join a support group, such as Nicotine Anonymous, for people who are trying to quit smoking.  ?? Consider signing up for a smoking cessation program, such as the American Lung Association's Freedom from Smoking program.  ?? Get text messaging support. Go to the website at www.smokefree.gov to sign up for the [facility] program.  ?? Set a quit date. Pick your date carefully so that it is not right in the middle of a big deadline or stressful time. Once you quit, do not even take a puff. Get rid of all ashtrays and lighters after your last cigarette. Clean your house  and your clothes so that they do not smell of smoke.  ?? Learn how to be a nonsmoker. Think about ways you can avoid those things that make you reach for a cigarette.  ? Avoid situations that put you at greatest risk for smoking. For some people, it is hard to have a drink with friends without smoking. For others, they might skip a coffee break with coworkers who smoke.  ? Change your daily routine. Take a different route to work or eat a meal in a different place.  ?? Cut down on stress. Calm yourself or release tension by doing an activity you enjoy, such as reading a book, taking a hot bath, or gardening.  ?? Talk to your doctor or pharmacist about nicotine replacement therapy, which replaces the nicotine in your body. You still get nicotine but you do not use tobacco. Nicotine replacement products help you slowly reduce the amount of nicotine you need. These products come in several forms, many of them available over-the-counter:  ? Nicotine patches  ? Nicotine gum and lozenges  ? Nicotine inhaler  ?? Ask your doctor about bupropion (Wellbutrin) or varenicline (Chantix), which are prescription medicines. They do not contain nicotine. They help you by reducing withdrawal symptoms, such as stress  and anxiety.  ?? Some people find hypnosis, acupuncture, and massage helpful for ending the smoking habit.  ?? Eat a healthy diet and get regular exercise. Having healthy habits will help your body move past its craving for nicotine.  ?? Be prepared to keep trying. Most people are not successful the first few times they try to quit. Do not get mad at yourself if you smoke again. Make a list of things you learned and think about when you want to try again, such as next week, next month, or next year.  Where can you learn more?  Go to [email or web address]  Enter [id number] in the search box to learn more about "Stopping Smoking: Care Instructions."  Current as of: November 24, 2018??????????????????????????????Content Version:  12.8  ?? 2006-2021 [employer].   Care instructions adapted under license by [employer] (which disclaims liability or warranty for this information). If you have questions about a medical condition or this instruction, always ask your healthcare professional. [employer] disclaims any warranty or liability for your use of this information.  CXR TODAY  CALL DR WIND FOR APT  Follow up 1 week.

## 2020-02-01 NOTE — Progress Notes (Signed)
 Chief Complaint   Patient presents with   . Wheezing     follow up COPD     No flowsheet data found.    3 most recent PHQ Screens 02/01/2020   Little interest or pleasure in doing things Not at all   Feeling down, depressed, irritable, or hopeless Not at all   Total Score PHQ 2 0       Abuse Screening Questionnaire 02/01/2020   Do you ever feel afraid of your partner? N   Are you in a relationship with someone who physically or mentally threatens you? N   Is it safe for you to go home? Y       ADL Assessment 02/01/2020   Feeding yourself No Help Needed   Getting from bed to chair No Help Needed   Getting dressed No Help Needed   Bathing or showering No Help Needed   Walk across the room (includes cane/walker) No Help Needed   Using the telphone No Help Needed   Taking your medications No Help Needed   Preparing meals No Help Needed   Managing money (expenses/bills) No Help Needed   Moderately strenuous housework (laundry) No Help Needed   Shopping for personal items (toiletries/medicines) No Help Needed   Shopping for groceries No Help Needed   Driving No Help Needed   Climbing a flight of stairs No Help Needed   Getting to places beyond walking distances No Help Needed

## 2020-02-01 NOTE — Progress Notes (Signed)
Subjective: (As above and below)     Chief Complaint   Patient presents with   ??? Wheezing     follow up COPD     HPI  She is a 49 y.o. year old female who presents for evaluation. Was previously followed by [facility]. Moved from [place], [place]. Do not have old records yet.   Went to cardiology Dr. Francesco Runner. Hx 4 heart attacks. Hereditary- strong cardiac family history. Pt reported that the cardiologist stated that "everything looked good and follow up 6 months."     During previous visit 01/18/20 gave a steroid script for 5 days which helped wheezing. Finished course of Prednisone.  Started her on Tiotropium (Spiriva) inhaler  last visit. Stated it is "working well."    Proventil albuterol inhaler 2 puffs q 4hr PRN. Uses once daily.  Smokes 10-15 cigs day x 30 years. Has COPD. Cough occas green sputum.   Tried Nicoderm patches. Discussed smoking cessation    Called ortho Dr. Pam Drown for carpal tunnel. They do not accept her insurance. Discussed referral to Dr. Stacie Acres.   Stated Gapapentin helps.  Complains of neuropathy/ dull nerve pain in first 4 fingers right hand (except 5th digit). Tried wrist braces without success.      Most recent A1C was 5.9. Stated Metformin causing a lot of GI upset and nausea. Suggested that she just try 500mg  at dinner.   Got Vitamin D and Calcium and taking daily.     PMP 000    Reviewed PmHx, RxHx, FmHx, SocHx, AllgHx and updated in chart.  Patient Active Problem List   Diagnosis Code   ??? Chronic obstructive pulmonary disease, unspecified COPD type ([place]) J44.9   ??? Prediabetes R73.03   ??? Carpal tunnel syndrome of right wrist G56.01     Review of Systems:  Gen: no fatigue, fever, chills  Eyes: no excessive tearing, itching, or discharge  Nose: no rhinorrhea, no sinus pain  Mouth: no oral lesions, no sore throat  Resp: no shortness of breath, no wheezing, + cough  CV: no chest pain, no paroxysmal nocturnal dyspnea  Abd: no nausea, no heartburn, no diarrhea, no constipation, no abdominal  pain  Neuro: no headaches, no syncope or presyncopal episodes  Endo: no polyuria, no polydipsia  Heme: no lymphadenopathy, no easy bruising or bleeding    No Known Allergies  Current Outpatient Medications   Medication Sig   ??? metFORMIN (GLUCOPHAGE) 500 mg tablet Take 2 Tabs by mouth daily (with dinner). Indications: type 2 diabetes mellitus   ??? albuterol (PROVENTIL HFA, VENTOLIN HFA, PROAIR HFA) 90 mcg/actuation inhaler Take 2 Puffs by inhalation every four (4) hours as needed for Wheezing. Indications: chronic obstructive pulmonary disease   ??? tiotropium (SPIRIVA) 18 mcg inhalation capsule Take 1 Cap by inhalation daily. Indications: bronchospasm prevention with COPD   ??? gabapentin (NEURONTIN) 100 mg capsule Take 1 Cap by mouth nightly as needed for Pain for up to 14 days. Max Daily Amount: 100 mg. Indications: neuropathic pain   ??? amitriptyline (ELAVIL) 50 mg tablet Take 2 Tabs by mouth nightly.   ??? amLODIPine (NORVASC) 10 mg tablet Take 1 Tab by mouth daily.   ??? celecoxib (CeleBREX) 200 mg capsule Take 1 Cap by mouth daily.   ??? citalopram (CELEXA) 40 mg tablet Take 1 Tab by mouth daily.   ??? isosorbide mononitrate ER (IMDUR) 60 mg CR tablet Take 1 Tab by mouth two (2) times a day.   ??? nitroglycerin (NITROSTAT) 0.4 mg SL  tablet 1 Tab by SubLINGual route every five (5) minutes as needed for Chest Pain. Up to 3 doses.   ??? rosuvastatin (CRESTOR) 20 mg tablet Take 1 Tab by mouth nightly. Indications: excessive fat in the blood   ??? predniSONE (DELTASONE) 10 mg tablet 4 tabs for 2 day, 3 tabs for 2 days, 2 tabs for 2 days, 1tab for 2 days  Indications: worsening chronic obstructive pulmonary disease (Patient not taking: Reported on 02/01/2020)     No current facility-administered medications for this visit.     Objective:     Visit Vitals  BP 109/74 (BP 1 Location: Left upper arm, BP Patient Position: Sitting, BP Cuff Size: Large adult)   Pulse 84   Temp 97.7 ??F (36.5 ??C) (Oral)   Resp 20   Ht 5' 5.75" (1.67 m)   Wt 213  lb (96.6 kg)   SpO2 93%   BMI 34.64 kg/m??      Physical Examination:   Gen: alert, oriented, no acute distress  Head: normocephalic, atraumatic  Resp: no increased work of breathing, lungs exp wheezes and scattered rhonchii- to ausculation bilaterally  CV: S1, S2 normal,+ murmur, rubs, or gallops.    Abd: soft, not tender, not distended.  No hepatosplenomegaly. Normal bowel sounds.  No hernias.   Skin: no lesion or rash  Musc: POSITIVE PHALEN TEST. Discomfort with flexion and extension of right wrist.     Assessment/ Plan:     1. Right wrist pain  - REFERRAL TO ORTHOPEDICS    2. Chronic obstructive pulmonary disease, unspecified COPD type ([facility])  - XR CHEST PA LAT; Future    3. Carpal tunnel syndrome of right wrist  - gabapentin (NEURONTIN) 100 mg capsule; Take 1 Capsule by mouth nightly as needed for Pain for up to 30 days. Max Daily Amount: 100 mg. Indications: neuropathic pain  Dispense: 30 Capsule; Refill: 0    4. Prediabetes    Call ortho Dr.Wind and make apt.  CXR pending.   Renewed Gabapentin.  Follow up 1 week.    I have discussed the diagnosis with the patient and the intended plan as seen in the above orders.  The patient has received an after-visit summary and questions were answered concerning future plans. If symptoms worsen, go to the ER.    Medication Side Effects and Warnings were discussed with patient: yes  Patient Labs were reviewed: yes  Patient Past Records were reviewed: Cardiology note and previous visits.     Standley Dakins, NP

## 2020-02-09 ENCOUNTER — Ambulatory Visit: Attending: Nurse Practitioner | Primary: Nurse Practitioner

## 2020-02-09 NOTE — Progress Notes (Signed)
ERRONEOUS ENCOUNTER
